# Patient Record
Sex: Female | Born: 1980 | Race: Black or African American | Hispanic: No | Marital: Single | Smoking: Current every day smoker
Health system: Southern US, Community
[De-identification: ages and names within clinical notes are randomized; demographics above are authoritative.]

## PROBLEM LIST (undated history)

## (undated) DIAGNOSIS — L509 Urticaria, unspecified: Secondary | ICD-10-CM

## (undated) DIAGNOSIS — Z973 Presence of spectacles and contact lenses: Secondary | ICD-10-CM

## (undated) DIAGNOSIS — F419 Anxiety disorder, unspecified: Secondary | ICD-10-CM

## (undated) DIAGNOSIS — F319 Bipolar disorder, unspecified: Secondary | ICD-10-CM

## (undated) HISTORY — PX: SALPINGECTOMY: SHX328

## (undated) HISTORY — DX: Urticaria, unspecified: L50.9

## (undated) HISTORY — PX: WISDOM TOOTH EXTRACTION: SHX21

---

## 2019-08-19 ENCOUNTER — Emergency Department (HOSPITAL_COMMUNITY): Payer: Self-pay

## 2019-08-19 ENCOUNTER — Emergency Department (HOSPITAL_COMMUNITY)
Admission: EM | Admit: 2019-08-19 | Discharge: 2019-08-19 | Disposition: A | Discharge status: Home / Self Care | Payer: Self-pay | Attending: Emergency Medicine | Admitting: Emergency Medicine

## 2019-08-19 DIAGNOSIS — X501XXA Overexertion from prolonged static or awkward postures, initial encounter: Secondary | ICD-10-CM | POA: Insufficient documentation

## 2019-08-19 DIAGNOSIS — S93401A Sprain of unspecified ligament of right ankle, initial encounter: Secondary | ICD-10-CM | POA: Insufficient documentation

## 2019-08-19 DIAGNOSIS — Y9301 Activity, walking, marching and hiking: Secondary | ICD-10-CM | POA: Insufficient documentation

## 2019-08-19 DIAGNOSIS — Y929 Unspecified place or not applicable: Secondary | ICD-10-CM | POA: Insufficient documentation

## 2019-08-19 DIAGNOSIS — Y999 Unspecified external cause status: Secondary | ICD-10-CM | POA: Insufficient documentation

## 2019-08-19 MED ORDER — IBUPROFEN 800 MG PO TABS
800.0000 mg | ORAL_TABLET | Freq: Once | ORAL | Status: AC
  Administered 2019-08-19: 800 mg via ORAL
  Filled 2019-08-19: qty 1

## 2019-08-19 MED ORDER — OXYCODONE-ACETAMINOPHEN 5-325 MG PO TABS
1.0000 | ORAL_TABLET | ORAL | Status: DC | PRN
  Filled 2019-08-19: qty 1

## 2019-08-19 NOTE — ED Notes (Signed)
Patient verbalizes understanding of discharge instructions. Opportunity for questioning and answers were provided. Armband removed by staff, pt discharged from ED.  

## 2019-08-19 NOTE — Progress Notes (Signed)
Orthopedic Tech Progress Note Patient Details:  Christina Holden 09-11-1981 [id number]  Ortho Devices Type of Ortho Device: Crutches, CAM walker Ortho Device/Splint Location: right Ortho Device/Splint Interventions: Application   Post Interventions Patient Tolerated: Well Instructions Provided: Care of device   [place] Pink 08/19/2019, 5:01 PM

## 2019-08-19 NOTE — ED Notes (Signed)
Ortho contacted to fit cam walker and crutches.

## 2019-08-19 NOTE — ED Provider Notes (Signed)
[facility] EMERGENCY DEPARTMENT Provider Note   CSN: [id number] Arrival date & time: 08/19/19  1300     History   Chief Complaint Chief Complaint  Patient presents with  . Ankle Pain    HPI Christina Holden is a 38 y.o. female.     38 year old female presents with complaint of right ankle pain.  Patient states that she was walking down a step when she rolled her ankle and felt 3 pops in the outside of her ankle.  Patient has been unable to bear weight since the injury, reports pain to the lateral ankle.  No other injuries, complaints, concerns.  Patient was given Percocet in triage which has not helped with her pain.     No past medical history on file.  There are no active problems to display for this patient.   OB History   No obstetric history on file.      Home Medications    Prior to Admission medications   Not on File    Family History No family history on file.  Social History Social History   Tobacco Use  . Smoking status: Not on file  Substance Use Topics  . Alcohol use: Not on file  . Drug use: Not on file     Allergies   Rocephin [ceftriaxone sodium in dextrose]   Review of Systems Review of Systems  Constitutional: Negative for fever.  Musculoskeletal: Positive for gait problem, joint swelling and myalgias. Negative for arthralgias.  Skin: Negative for rash and wound.  Allergic/Immunologic: Negative for immunocompromised state.  Neurological: Negative for weakness and numbness.  All other systems reviewed and are negative.    Physical Exam Updated Vital Signs BP (!) 172/139 (BP Location: Left Arm)   Pulse (!) 115   Temp 98.9 F (37.2 C) (Oral)   Resp 18   SpO2 98%   Physical Exam Vitals signs and nursing note reviewed.  Constitutional:      General: She is not in acute distress.    Appearance: She is well-developed. She is not diaphoretic.  HENT:     Head: Normocephalic and atraumatic.   Cardiovascular:     Pulses: Normal pulses.  Pulmonary:     Effort: Pulmonary effort is normal.  Musculoskeletal:        General: Swelling and tenderness present. No deformity.     Right ankle: She exhibits decreased range of motion and swelling. She exhibits no deformity, no laceration and normal pulse. Tenderness. Lateral malleolus tenderness found. No head of 5th metatarsal and no proximal fibula tenderness found.  Skin:    General: Skin is warm and dry.     Findings: No erythema or rash.  Neurological:     Mental Status: She is alert and oriented to person, place, and time.     Sensory: No sensory deficit.  Psychiatric:        Behavior: Behavior normal.      ED Treatments / Results  Labs (all labs ordered are listed, but only abnormal results are displayed) Labs Reviewed - No data to display  EKG None  Radiology Dg Ankle Complete Right  Result Date: 08/19/2019 CLINICAL DATA:  Pain and swelling EXAM: RIGHT ANKLE - COMPLETE 3+ VIEW COMPARISON:  None. FINDINGS: No fracture or dislocation of the right ankle. Joint spaces are well preserved. Soft tissue edema over the lateral malleolus. IMPRESSION: No fracture or dislocation of the right ankle. Soft tissue edema over the lateral malleolus. Electronically Signed   By:  Eddie Candle M.D.   On: 08/19/2019 13:48    Procedures Procedures (including critical care time)  Medications Ordered in ED Medications  oxyCODONE-acetaminophen (PERCOCET/ROXICET) 5-325 MG per tablet 1 tablet (has no administration in time range)     Initial Impression / Assessment and Plan / ED Course  I have reviewed the triage vital signs and the nursing notes.  Pertinent labs & imaging results that were available during my care of the patient were reviewed by me and considered in my medical decision making (see chart for details).  Clinical Course as of Aug 18 1620  Fri Aug 18, 2361  8023 38 year old female presents with complaint of right ankle pain.   On exam has swelling with tenderness to lateral malleolus.  X-ray is negative for fracture.  Patient was placed in a cam walker, given crutches to weight-bear as tolerated, follow-up with orthopedics in 1 week if not improving.  Recommend ice and elevate and take Motrin Tylenol for pain.   [LM]    Clinical Course User Index [LM] Tacy Learn, PA-C      Final Clinical Impressions(s) / ED Diagnoses   Final diagnoses:  Sprain of right ankle, unspecified ligament, initial encounter    ED Discharge Orders    None       Tacy Learn, PA-C 08/19/19 1621    Maudie Flakes, MD 08/20/19 [phone]

## 2019-08-19 NOTE — ED Triage Notes (Signed)
Pt reports walking down steps just pta and heard a loud pop. Pt has pain and swelling to right ankle. Pt given ice and propped leg up.

## 2019-08-19 NOTE — Discharge Instructions (Signed)
Wear boot, weight-bear as tolerated. Elevate ankle above the level of your heart, apply ice for 30 minutes at a time.  Take Motrin and Tylenol as needed as directed for pain.   Follow-up with orthopedics in 1 week if not improving.

## 2019-08-19 NOTE — ED Notes (Signed)
Pt given Percocet during triage.

## 2020-01-13 ENCOUNTER — Ambulatory Visit: Payer: Medicaid Other | Attending: Internal Medicine

## 2020-01-13 DIAGNOSIS — Z20822 Contact with and (suspected) exposure to covid-19: Secondary | ICD-10-CM

## 2020-01-14 LAB — NOVEL CORONAVIRUS, NAA: SARS-CoV-2, NAA: NOT DETECTED

## 2020-07-25 ENCOUNTER — Other Ambulatory Visit: Payer: Self-pay

## 2020-07-25 ENCOUNTER — Ambulatory Visit (HOSPITAL_COMMUNITY)
Admission: EM | Admit: 2020-07-25 | Discharge: 2020-07-25 | Disposition: A | Discharge status: Home / Self Care | Payer: Self-pay | Attending: Urgent Care | Admitting: Urgent Care

## 2020-07-25 ENCOUNTER — Encounter (HOSPITAL_COMMUNITY): Payer: Self-pay | Admitting: *Deleted

## 2020-07-25 DIAGNOSIS — K047 Periapical abscess without sinus: Secondary | ICD-10-CM

## 2020-07-25 DIAGNOSIS — K0889 Other specified disorders of teeth and supporting structures: Secondary | ICD-10-CM

## 2020-07-25 MED ORDER — NAPROXEN 500 MG PO TABS: 500.0000 mg | ORAL_TABLET | Freq: Two times a day (BID) | ORAL | 0 refills | Status: DC

## 2020-07-25 MED ORDER — AMOXICILLIN-POT CLAVULANATE 875-125 MG PO TABS: 1.0000 | ORAL_TABLET | Freq: Two times a day (BID) | ORAL | 0 refills | Status: DC

## 2020-07-25 NOTE — ED Triage Notes (Addendum)
Pt reports LT upper tooth broke today and she now has dental pain. Pain 8/10

## 2020-07-25 NOTE — ED Provider Notes (Signed)
°  [facility]   MRN: [id number] DOB: 12-16-1980  Subjective:   Christina Holden is a 39 y.o. female presenting for acute onset of left upper molar pain with facial swelling and tenderness.  Patient states that [place] is [place] from the city, just moved here.  Does not have any dentist.  Has not been able to take any medications for pain.  Has previously taken penicillin and amoxicillin without any issues.  Denies taking chronic medications.  Allergies  Allergen Reactions   Rocephin [Ceftriaxone Sodium In Dextrose]     History reviewed. No pertinent past medical history.   History reviewed. No pertinent surgical history.  History reviewed. No pertinent family history.  Social History   Tobacco Use   Smoking status: Current Every Day Smoker   Smokeless tobacco: Never Used  Vaping Use   Vaping Use: Never used  Substance Use Topics   Alcohol use: Yes    Comment: but not today   Drug use: Yes    Types: Marijuana    Comment: social    ROS   Objective:   Vitals: BP (!) 142/81 (BP Location: Right Arm)    Pulse 92    Temp 98.1 F (36.7 C) (Oral)    Resp 18    Ht 5\' 3"  (1.6 m)    LMP 06/24/2020    SpO2 99%   Physical Exam Constitutional:      General: She is not in acute distress.    Appearance: Normal appearance. She is well-developed. She is not ill-appearing, toxic-appearing or diaphoretic.  HENT:     Head: Normocephalic and atraumatic.     Nose: Nose normal.     Mouth/Throat:     Mouth: Mucous membranes are moist.     Dentition: Abnormal dentition. Gingival swelling and dental abscesses present.     Pharynx: Oropharynx is clear.   Eyes:     General: No scleral icterus.       Right eye: No discharge.        Left eye: No discharge.     Extraocular Movements: Extraocular movements intact.     Conjunctiva/sclera: Conjunctivae normal.     Pupils: Pupils are equal, round, and reactive to light.  Cardiovascular:     Rate and Rhythm: Normal rate.    Pulmonary:     Effort: Pulmonary effort is normal.  Skin:    General: Skin is warm and dry.  Neurological:     General: No focal deficit present.     Mental Status: She is alert and oriented to person, place, and time.  Psychiatric:        Mood and Affect: Mood normal.        Behavior: Behavior normal.        Thought Content: Thought content normal.        Judgment: Judgment normal.      Assessment and Plan :   PDMP not reviewed this encounter.  1. Dental abscess   2. Pain, dental     Start Augmentin for dental infection/abscess, use naproxen for pain and inflammation. Emphasized need for dental surgeon consult. Counseled patient on potential for adverse effects with medications prescribed/recommended today, strict ER and return-to-clinic precautions discussed, patient verbalized understanding.    06/26/2020, Wallis Bamberg 07/25/20 1757

## 2020-07-25 NOTE — Discharge Instructions (Signed)
Make sure you schedule an appointment with a dentist/dental surgeon as soon as possible.  You may try some of the resources below.     [facility] [phone] extension [phone]  Dr. Civils [phone] [address]  [facility] [phone] [address]  [facility] [phone] extension [phone] [place], [place], [address] First come first serve for the first 10 clients.  May do simple extractions only, no wisdom teeth or surgery.  You may try the second for Thursday of the month starting at 6:30 AM.  [employer] You may call the school to see if they are still helping to provide dental care for emergent cases.  

## 2020-08-29 ENCOUNTER — Ambulatory Visit (HOSPITAL_COMMUNITY)
Admission: EM | Admit: 2020-08-29 | Discharge: 2020-08-29 | Disposition: A | Discharge status: Home / Self Care | Payer: Medicaid Other | Attending: Emergency Medicine | Admitting: Emergency Medicine

## 2020-08-29 ENCOUNTER — Other Ambulatory Visit: Payer: Self-pay

## 2020-08-29 ENCOUNTER — Encounter (HOSPITAL_COMMUNITY): Payer: Self-pay | Admitting: Emergency Medicine

## 2020-08-29 DIAGNOSIS — Z20822 Contact with and (suspected) exposure to covid-19: Secondary | ICD-10-CM | POA: Insufficient documentation

## 2020-08-29 DIAGNOSIS — F17200 Nicotine dependence, unspecified, uncomplicated: Secondary | ICD-10-CM | POA: Diagnosis not present

## 2020-08-29 DIAGNOSIS — R112 Nausea with vomiting, unspecified: Secondary | ICD-10-CM | POA: Insufficient documentation

## 2020-08-29 DIAGNOSIS — J069 Acute upper respiratory infection, unspecified: Secondary | ICD-10-CM | POA: Insufficient documentation

## 2020-08-29 LAB — SARS CORONAVIRUS 2 (TAT 6-24 HRS): SARS Coronavirus 2: NEGATIVE

## 2020-08-29 MED ORDER — BENZONATATE 200 MG PO CAPS: 200.0000 mg | ORAL_CAPSULE | Freq: Three times a day (TID) | ORAL | 0 refills | Status: AC | PRN

## 2020-08-29 MED ORDER — ONDANSETRON 4 MG PO TBDP: 4.0000 mg | ORAL_TABLET | Freq: Three times a day (TID) | ORAL | 0 refills | Status: DC | PRN

## 2020-08-29 NOTE — Discharge Instructions (Signed)
Covid test pending, monitor my chart for results Zofran as needed for nausea Rest and drink plenty fluids Tessalon/benzonatate every 8 hours for cough Mucinex DM for further relief of congestion and cough Ibuprofen and tylenol for fever/headache/ aches Return if not improving or worsening

## 2020-08-29 NOTE — ED Provider Notes (Signed)
[facility]    CSN: [id number] Arrival date & time: 08/29/20  0808      History   Chief Complaint Chief Complaint  Patient presents with   Sore Throat   Generalized Body Aches    HPI Christina Holden is a 39 y.o. female presenting today for evaluation of URI symptoms and vomiting.  Patient reports on Friday, approximately 4 days ago began to develop an itchy throat, this resolved and since has developed cough congestion body aches fevers chills.  Has had poor appetite, last night attempted to have Popeyes and developed vomiting.  She has been tolerating liquids and Pedialyte since.  Does not denies any close sick contacts.  HPI  History reviewed. No pertinent past medical history.  There are no problems to display for this patient.   History reviewed. No pertinent surgical history.  OB History   No obstetric history on file.      Home Medications    Prior to Admission medications   Medication Sig Start Date End Date Taking? Authorizing Provider  benzonatate (TESSALON) 200 MG capsule Take 1 capsule (200 mg total) by mouth 3 (three) times daily as needed for up to 7 days for cough. 08/29/20 09/05/20  Torris House C, PA-C  ondansetron (ZOFRAN ODT) 4 MG disintegrating tablet Take 1 tablet (4 mg total) by mouth every 8 (eight) hours as needed for nausea or vomiting. 08/29/20   Ervan Heber, Junius Creamer, PA-C    Family History Family History  Problem Relation Age of Onset   Sarcoidosis Mother    Seizures Father     Social History Social History   Tobacco Use   Smoking status: Current Every Day Smoker   Smokeless tobacco: Never Used  Building services engineer Use: Never used  Substance Use Topics   Alcohol use: Yes    Comment: but not today   Drug use: Yes    Types: Marijuana    Comment: social     Allergies   Rocephin [ceftriaxone sodium in dextrose]   Review of Systems Review of Systems  Constitutional: Positive for appetite change, chills,  fatigue and fever. Negative for activity change.  HENT: Positive for congestion, rhinorrhea and sore throat. Negative for ear pain, sinus pressure and trouble swallowing.   Eyes: Negative for discharge and redness.  Respiratory: Positive for cough. Negative for chest tightness and shortness of breath.   Cardiovascular: Negative for chest pain.  Gastrointestinal: Positive for nausea and vomiting. Negative for abdominal pain and diarrhea.  Musculoskeletal: Negative for myalgias.  Skin: Negative for rash.  Neurological: Negative for dizziness, light-headedness and headaches.     Physical Exam Triage Vital Signs ED Triage Vitals  Enc Vitals Group     BP 08/29/20 0848 (!) 128/98     Pulse Rate 08/29/20 0848 (!) 103     Resp 08/29/20 0848 20     Temp 08/29/20 0848 98.8 F (37.1 C)     Temp Source 08/29/20 0848 Oral     SpO2 08/29/20 0848 99 %     Weight --      Height --      Head Circumference --      Peak Flow --      Pain Score 08/29/20 0844 7     Pain Loc --      Pain Edu? --      Excl. in GC? --    No data found.  Updated Vital Signs BP (!) 128/98 (BP Location: Left Arm)  Comment: large cuff   Pulse (!) 103    Temp 98.8 F (37.1 C) (Oral)    Resp 20    SpO2 99%   Visual Acuity Right Eye Distance:   Left Eye Distance:   Bilateral Distance:    Right Eye Near:   Left Eye Near:    Bilateral Near:     Physical Exam Vitals and nursing note reviewed.  Constitutional:      Appearance: She is well-developed.     Comments: No acute distress  HENT:     Head: Normocephalic and atraumatic.     Ears:     Comments: Bilateral ears without tenderness to palpation of external auricle, tragus and mastoid, EAC's without erythema or swelling, TM's with good bony landmarks and cone of light. Non erythematous.     Nose: Nose normal.     Mouth/Throat:     Comments: Oral mucosa pink and moist, no tonsillar enlargement or exudate. Posterior pharynx patent and nonerythematous, no uvula  deviation or swelling. Normal phonation. Eyes:     Conjunctiva/sclera: Conjunctivae normal.  Cardiovascular:     Rate and Rhythm: Normal rate.  Pulmonary:     Effort: Pulmonary effort is normal. No respiratory distress.     Comments: Breathing comfortably at rest, CTABL, no wheezing, rales or other adventitious sounds auscultated Abdominal:     General: There is no distension.     Comments: Soft, nondistended, nontender light deep palpation.abdomen  Musculoskeletal:        General: Normal range of motion.     Cervical back: Neck supple.  Skin:    General: Skin is warm and dry.  Neurological:     Mental Status: She is alert and oriented to person, place, and time.      UC Treatments / Results  Labs (all labs ordered are listed, but only abnormal results are displayed) Labs Reviewed  SARS CORONAVIRUS 2 (TAT 6-24 HRS)    EKG   Radiology No results found.  Procedures Procedures (including critical care time)  Medications Ordered in UC Medications - No data to display  Initial Impression / Assessment and Plan / UC Course  I have reviewed the triage vital signs and the nursing notes.  Pertinent labs & imaging results that were available during my care of the patient were reviewed by me and considered in my medical decision making (see chart for details).     Covid test pending, suspect viral URI recommending symptomatic and supportive care of URI symptoms and GI symptoms.  No abdominal tenderness.  Discussed strict return precautions. Patient verbalized understanding and is agreeable with plan.  Final Clinical Impressions(s) / UC Diagnoses   Final diagnoses:  Viral URI with cough  Non-intractable vomiting with nausea, unspecified vomiting type     Discharge Instructions     Covid test pending, monitor my chart for results Zofran as needed for nausea Rest and drink plenty fluids Tessalon/benzonatate every 8 hours for cough Mucinex DM for further relief of  congestion and cough Ibuprofen and tylenol for fever/headache/ aches Return if not improving or worsening     ED Prescriptions    Medication Sig Dispense Auth. Provider   benzonatate (TESSALON) 200 MG capsule Take 1 capsule (200 mg total) by mouth 3 (three) times daily as needed for up to 7 days for cough. 28 capsule Keneth Borg C, PA-C   ondansetron (ZOFRAN ODT) 4 MG disintegrating tablet Take 1 tablet (4 mg total) by mouth every 8 (eight) hours as needed  for nausea or vomiting. 20 tablet Rebbecca Osuna, [place] C, PA-C     PDMP not reviewed this encounter.   Lew Dawes, [place] 08/29/20 [phone]

## 2020-08-29 NOTE — ED Triage Notes (Signed)
Patient started feeling bad on Friday.  Initially had a scratchy throat.  Now has runny nose, chills, fever, general body aches.    Reports vomiting yesterday evening.  No vomiting today.   Patient has taken theraflu for symptoms.

## 2020-10-23 ENCOUNTER — Encounter (HOSPITAL_COMMUNITY): Payer: Self-pay | Admitting: Emergency Medicine

## 2020-10-23 ENCOUNTER — Other Ambulatory Visit: Payer: Self-pay

## 2020-10-23 ENCOUNTER — Ambulatory Visit (HOSPITAL_COMMUNITY)
Admission: EM | Admit: 2020-10-23 | Discharge: 2020-10-23 | Disposition: A | Discharge status: Home / Self Care | Payer: Medicaid Other | Attending: Internal Medicine | Admitting: Internal Medicine

## 2020-10-23 DIAGNOSIS — Z20822 Contact with and (suspected) exposure to covid-19: Secondary | ICD-10-CM | POA: Diagnosis present

## 2020-10-23 DIAGNOSIS — J111 Influenza due to unidentified influenza virus with other respiratory manifestations: Secondary | ICD-10-CM

## 2020-10-23 MED ORDER — ACETAMINOPHEN 325 MG PO TABS
975.0000 mg | ORAL_TABLET | Freq: Once | ORAL | Status: AC
  Administered 2020-10-23: 975 mg via ORAL

## 2020-10-23 MED ORDER — ONDANSETRON 4 MG PO TBDP
4.0000 mg | ORAL_TABLET | Freq: Once | ORAL | Status: AC
  Administered 2020-10-23: 4 mg via ORAL

## 2020-10-23 MED ORDER — ONDANSETRON HCL 4 MG PO TABS: 4.0000 mg | ORAL_TABLET | Freq: Four times a day (QID) | ORAL | 0 refills | Status: DC

## 2020-10-23 MED ORDER — ACETAMINOPHEN 500 MG PO TABS: 1000.0000 mg | ORAL_TABLET | Freq: Four times a day (QID) | ORAL | 0 refills | Status: DC | PRN

## 2020-10-23 MED ORDER — OSELTAMIVIR PHOSPHATE 75 MG PO CAPS: 75.0000 mg | ORAL_CAPSULE | Freq: Two times a day (BID) | ORAL | 0 refills | Status: DC

## 2020-10-23 MED ORDER — ACETAMINOPHEN 325 MG PO TABS
ORAL_TABLET | ORAL | Status: AC
  Filled 2020-10-23: qty 3

## 2020-10-23 MED ORDER — ONDANSETRON 4 MG PO TBDP
ORAL_TABLET | ORAL | Status: AC
  Filled 2020-10-23: qty 1

## 2020-10-23 NOTE — ED Triage Notes (Signed)
Patient c/o headache, generalized body aches, and fever x 1 day.   Patient endorses dizziness and fever of 101 F at home.   Patient endorses a recent COVID-19 exposure.   Patient has taken Aleve w/ no relief of symptoms.

## 2020-10-23 NOTE — Discharge Instructions (Addendum)
Take Tylenol up to 1000 mg every 6 hours and you may take Ibuprofen 800 mg in between Tylenol doses.   If your Covid test ends up positive you may take the following supplements to help your immune system be stronger to fight this viral infection Take Quarcetin 500 mg three times a day x 7 days with Zinc 50 mg ones a day x 7 days. The quarcetin is an antiviral and anti-inflammatory supplement which helps open the zinc channels in the cell to absorb Zinc. Zinc helps decrease the virus load in your body. Take Melatonin 6-10 mg at bed time which also helps support your immune system.  Also make sure to take Vit D 5,000 IU per day with a fatty meal and Vit C 1000 mg a day until you are completely better. Stay on Vitamin D 2,000  and C the rest of the season.  Don't lay around, keep active and walk as much as you are able to to prevent worsening of your symptoms.  Follow up with your family Dr next week.  If you get short of breath and you are able to check  your oxygen with a pulse oxygen meter, if it gets to 92% or less, you need to go to the hospital to be admitted. If you dont have one, come back here and we will assess you.

## 2020-10-23 NOTE — ED Provider Notes (Signed)
[facility]    CSN: [id number] Arrival date & time: 10/23/20  1554      History   Chief Complaint Chief Complaint  Patient presents with  . Generalized Body Aches    HPI Christina Holden is a 39 y.o. female who presents with onset of HA, body aches and fever for 1 day. Has had a fever up to 101.  Has been around a positive covid relative whom she spent xmas with. Has been taking Aleeve but is not helping. Has felt nauseous today. Denies UTI symptoms, cough, or abnormal vaginal discharge.    History reviewed. No pertinent past medical history.  There are no problems to display for this patient.   History reviewed. No pertinent surgical history.  OB History   No obstetric history on file.      Home Medications    Prior to Admission medications   Medication Sig Start Date End Date Taking? Authorizing Provider  ondansetron (ZOFRAN ODT) 4 MG disintegrating tablet Take 1 tablet (4 mg total) by mouth every 8 (eight) hours as needed for nausea or vomiting. 08/29/20   Wieters, Junius Creamer, PA-C    Family History Family History  Problem Relation Age of Onset  . Sarcoidosis Mother   . Seizures Father     Social History Social History   Tobacco Use  . Smoking status: Current Every Day Smoker  . Smokeless tobacco: Never Used  Vaping Use  . Vaping Use: Never used  Substance Use Topics  . Alcohol use: Yes    Comment: but not today  . Drug use: Yes    Types: Marijuana    Comment: social     Allergies   Rocephin [ceftriaxone sodium in dextrose]   Review of Systems Review of Systems  Constitutional: Positive for activity change, appetite change, chills, diaphoresis, fatigue and fever.  HENT: Negative for congestion, dental problem, ear discharge, ear pain, postnasal drip, rhinorrhea, sinus pressure, sore throat and trouble swallowing.   Eyes: Negative for discharge.  Respiratory: Negative for cough, chest tightness and shortness of breath.    Cardiovascular: Negative for chest pain.  Gastrointestinal: Positive for nausea. Negative for abdominal pain, diarrhea and vomiting.  Genitourinary: Negative for difficulty urinating, dysuria and vaginal discharge.  Musculoskeletal: Positive for myalgias. Negative for gait problem.  Skin: Negative for rash.  Neurological: Positive for headaches. Negative for dizziness and weakness.     Physical Exam Triage Vital Signs ED Triage Vitals  Enc Vitals Group     BP 10/23/20 1826 128/73     Pulse Rate 10/23/20 1826 100     Resp 10/23/20 1826 19     Temp 10/23/20 1826 100.1 F (37.8 C)     Temp Source 10/23/20 1826 Oral     SpO2 10/23/20 1826 100 %     Weight 10/23/20 1824 205 lb (93 kg)     Height 10/23/20 1824 5' 4.5" (1.638 m)     Head Circumference --      Peak Flow --      Pain Score 10/23/20 1824 10     Pain Loc --      Pain Edu? --      Excl. in GC? --    No data found.  Updated Vital Signs BP 128/73 (BP Location: Left Arm)   Pulse 100   Temp 100.1 F (37.8 C) (Oral)   Resp 19   Ht 5' 4.5" (1.638 m)   Wt 205 lb (93 kg)   LMP 09/24/2020  SpO2 100%   BMI 34.64 kg/m   Visual Acuity Right Eye Distance:   Left Eye Distance:   Bilateral Distance:    Right Eye Near:   Left Eye Near:    Bilateral Near:     Physical Exam   UC Treatments / Results  Labs (all labs ordered are listed, but only abnormal results are displayed) Labs Reviewed  RESP PANEL BY RT-PCR (FLU A&B, COVID) [id number]    EKG   Radiology No results found.  Procedures Procedures (including critical care time)  Medications Ordered in UC Medications - No data to display  Initial Impression / Assessment and Plan / UC Course  I have reviewed the triage vital signs and the nursing notes. Covid and flu test are pending.  She will check her mychart for results. We will call her if positive. I have her Rx for Tamiflu to fill if her flu test is positive. See instructions  Final Clinical  Impressions(s) / UC Diagnoses   Final diagnoses:  None   Discharge Instructions   None    ED Prescriptions    None     PDMP not reviewed this encounter.   Garey Ham, Cordelia Poche 10/23/20 1914

## 2020-10-24 LAB — RESP PANEL BY RT-PCR (FLU A&B, COVID) ARPGX2
Influenza A by PCR: NEGATIVE
Influenza B by PCR: NEGATIVE
SARS Coronavirus 2 by RT PCR: POSITIVE — AB

## 2021-05-01 ENCOUNTER — Ambulatory Visit (HOSPITAL_COMMUNITY)
Admission: EM | Admit: 2021-05-01 | Discharge: 2021-05-01 | Disposition: A | Discharge status: Home / Self Care | Payer: Commercial Managed Care - PPO | Attending: Medical Oncology | Admitting: Medical Oncology

## 2021-05-01 ENCOUNTER — Encounter (HOSPITAL_COMMUNITY): Payer: Self-pay | Admitting: *Deleted

## 2021-05-01 ENCOUNTER — Other Ambulatory Visit: Payer: Self-pay

## 2021-05-01 DIAGNOSIS — T7840XA Allergy, unspecified, initial encounter: Secondary | ICD-10-CM

## 2021-05-01 DIAGNOSIS — L509 Urticaria, unspecified: Secondary | ICD-10-CM

## 2021-05-01 MED ORDER — PREDNISONE 10 MG (21) PO TBPK: ORAL_TABLET | Freq: Every day | ORAL | 0 refills | Status: DC

## 2021-05-01 MED ORDER — DOXYCYCLINE HYCLATE 100 MG PO CAPS: 100.0000 mg | ORAL_CAPSULE | Freq: Two times a day (BID) | ORAL | 0 refills | Status: DC

## 2021-05-01 MED ORDER — DIPHENHYDRAMINE HCL 25 MG PO CAPS
ORAL_CAPSULE | ORAL | Status: AC
  Filled 2021-05-01: qty 2

## 2021-05-01 MED ORDER — DEXAMETHASONE SODIUM PHOSPHATE 10 MG/ML IJ SOLN
INTRAMUSCULAR | Status: AC
  Filled 2021-05-01: qty 1

## 2021-05-01 MED ORDER — FAMOTIDINE 20 MG PO TABS
ORAL_TABLET | ORAL | Status: AC
  Filled 2021-05-01: qty 2

## 2021-05-01 MED ORDER — DEXAMETHASONE SODIUM PHOSPHATE 10 MG/ML IJ SOLN
10.0000 mg | Freq: Once | INTRAMUSCULAR | Status: AC
  Administered 2021-05-01: 10 mg via INTRAMUSCULAR

## 2021-05-01 MED ORDER — DIPHENHYDRAMINE HCL 25 MG PO CAPS
50.0000 mg | ORAL_CAPSULE | Freq: Once | ORAL | Status: AC
  Administered 2021-05-01: 50 mg via ORAL

## 2021-05-01 MED ORDER — FAMOTIDINE 20 MG PO TABS
40.0000 mg | ORAL_TABLET | Freq: Every day | ORAL | Status: DC
  Administered 2021-05-01: 40 mg via ORAL

## 2021-05-01 NOTE — ED Provider Notes (Addendum)
[facility]    CSN: [id number] Arrival date & time: 05/01/21  1411      History   Chief Complaint Chief Complaint  Patient presents with   Allergic Reaction   swelling generlized    HPI Christina Holden is a 40 y.o. female.   HPI  Allergic reaction: I was alerted to patient immediately when she arrived to the office and she was evaluated in triage.  Patient was in acute distress and reported that she felt that she was having an allergic reaction.  She states that she had a bath with a new bath supplement last night.  Since then she has developed itching of her skin along with a red rash.  Last night she felt as if her tongue may have been slightly swollen however this has resolved.  She does have a bit of a headache today but is concerned that she is having worsening symptoms as the rash seems to be spreading.  No wheezing.  No chest pain.  No GI distress.  She does have some shortness of breath but she does admit that she believes she is having a panic attack I do fear that she is having an allergic reaction.  She denies any trouble swallowing.  Patient is speaking in full sentences.  She has no history of anaphylactic reaction.  She reports that she did take a COVID-19 test this morning which was negative due to her symptoms.  She has not taken anything else for symptoms. [facility]: UTD.   History reviewed. No pertinent past medical history.  There are no problems to display for this patient.   History reviewed. No pertinent surgical history.  OB History   No obstetric history on file.      Home Medications    Prior to Admission medications   Medication Sig Start Date End Date Taking? Authorizing Provider  acetaminophen (TYLENOL) 500 MG tablet Take 2 tablets (1,000 mg total) by mouth every 6 (six) hours as needed. Body aches and fever 10/23/20   Rodriguez-Southworth, Nettie Elm, PA-C  ondansetron (ZOFRAN ODT) 4 MG disintegrating tablet Take 1 tablet (4 mg total) by mouth  every 8 (eight) hours as needed for nausea or vomiting. 08/29/20   Wieters, Hallie C, PA-C  ondansetron (ZOFRAN) 4 MG tablet Take 1 tablet (4 mg total) by mouth every 6 (six) hours. 10/23/20   Rodriguez-Southworth, Nettie Elm, PA-C  oseltamivir (TAMIFLU) 75 MG capsule Take 1 capsule (75 mg total) by mouth every 12 (twelve) hours. 10/23/20   Rodriguez-Southworth, Nettie Elm, PA-C    Family History Family History  Problem Relation Age of Onset   Sarcoidosis Mother    Seizures Father     Social History Social History   Tobacco Use   Smoking status: Every Day    Pack years: 0.00   Smokeless tobacco: Never  Vaping Use   Vaping Use: Never used  Substance Use Topics   Alcohol use: Yes    Comment: but not today   Drug use: Yes    Types: Marijuana    Comment: social     Allergies   Rocephin [ceftriaxone sodium in dextrose]   Review of Systems Review of Systems  As stated above in HPI Physical Exam Triage Vital Signs ED Triage Vitals  Enc Vitals Group     BP 05/01/21 1413 (!) 113/101     Pulse Rate 05/01/21 1413 (!) 122     Resp 05/01/21 1413 (!) 25     Temp 05/01/21 1413 100 F (37.8  C)     Temp src --      SpO2 05/01/21 1413 100 %     Weight --      Height --      Head Circumference --      Peak Flow --      Pain Score 05/01/21 1414 10     Pain Loc --      Pain Edu? --      Excl. in [place]? --    No data found.  Updated Vital Signs BP 127/78 (BP Location: Left Arm)   Pulse (!) 102   Temp 100 F (37.8 C)   Resp 18   LMP 04/08/2021   SpO2 98%    Physical Exam Vitals and nursing note reviewed.  Constitutional:      General: She is in acute distress.     Appearance: Normal appearance. She is diaphoretic. She is not ill-appearing or toxic-appearing.  HENT:     Head: Normocephalic and atraumatic.     Nose: Nose normal.     Mouth/Throat:     Mouth: Mucous membranes are moist.     Pharynx: Oropharynx is clear.     Comments: Very clear airway without any swelling of  the mouth or tongue.  No facial swelling Eyes:     Extraocular Movements: Extraocular movements intact.     Pupils: Pupils are equal, round, and reactive to light.  Cardiovascular:     Rate and Rhythm: Regular rhythm. Tachycardia present.     Pulses: Normal pulses.     Heart sounds: Normal heart sounds.  Pulmonary:     Effort: Pulmonary effort is normal. No respiratory distress.     Breath sounds: Normal breath sounds. No stridor. No wheezing.  Musculoskeletal:     Cervical back: Neck supple.  Lymphadenopathy:     Cervical: No cervical adenopathy.  Skin:    Comments: Urticaria of legs, trunk and arms  Neurological:     Mental Status: She is alert and oriented to person, place, and time.  Psychiatric:     Comments: Patient appears extremely anxious and is very tearful     UC Treatments / Results  Labs (all labs ordered are listed, but only abnormal results are displayed) Labs Reviewed - No data to display  EKG   Radiology No results found.  Procedures Procedures (including critical care time)  Medications Ordered in UC Medications  famotidine (PEPCID) tablet 40 mg (40 mg Oral Given 05/01/21 1426)  dexamethasone (DECADRON) injection 10 mg (10 mg Intramuscular Given 05/01/21 1426)  diphenhydrAMINE (BENADRYL) capsule 50 mg (50 mg Oral Given 05/01/21 1421)    Initial Impression / Assessment and Plan / UC Course  I have reviewed the triage vital signs and the nursing notes.  Pertinent labs & imaging results that were available during my care of the patient were reviewed by me and considered in my medical decision making (see chart for details).     New.  I reviewed her vital signs with her which showed a oxygen of 100% along with a very good airway.  Immediately after having this discussion her tachycardia greatly improved and she felt much better.  We elected to go ahead and treat her for her urticaria and a suspected allergic reaction to the bath supplement but without any  evidence or risk of anaphylaxis so we are holding off on epinephrine at this time.  She was given Pepcid, Decadron and Benadryl.  She was monitored and vitals repeated.  Patient  continued to improve greatly.  After about 35 minutes she reportedly felt much better and her urticaria resolved.  Vitals improved.  She was sent home with prednisone for her to start tomorrow given her decadron rx given today to help prevent further systemic complications. She will not use the bath supplement again as she is likely allergic.    Final Clinical Impressions(s) / UC Diagnoses   Final diagnoses:  None   Discharge Instructions   None    ED Prescriptions   None    PDMP not reviewed this encounter.   Rushie Chestnut, PA-C 05/01/21 1538    [address] PA-C 05/01/21 1539

## 2021-11-26 ENCOUNTER — Ambulatory Visit (INDEPENDENT_AMBULATORY_CARE_PROVIDER_SITE_OTHER): Payer: Self-pay

## 2021-11-26 ENCOUNTER — Encounter (HOSPITAL_COMMUNITY): Payer: Self-pay

## 2021-11-26 ENCOUNTER — Other Ambulatory Visit: Payer: Self-pay

## 2021-11-26 ENCOUNTER — Ambulatory Visit (HOSPITAL_COMMUNITY)
Admission: EM | Admit: 2021-11-26 | Discharge: 2021-11-26 | Disposition: A | Discharge status: Home / Self Care | Payer: Self-pay | Attending: Family Medicine | Admitting: Family Medicine

## 2021-11-26 DIAGNOSIS — M79645 Pain in left finger(s): Secondary | ICD-10-CM

## 2021-11-26 MED ORDER — IBUPROFEN 800 MG PO TABS: 800.0000 mg | ORAL_TABLET | Freq: Three times a day (TID) | ORAL | 0 refills | Status: DC | PRN

## 2021-11-26 NOTE — ED Provider Notes (Signed)
[place]    CSN: [id number] Arrival date & time: 11/26/21  [id number]      History   Chief Complaint Chief Complaint  Patient presents with   Finger Injury    HPI Christina Holden is a 41 y.o. female.   HPI Here for left middle finger pain.  It began yesterday after she shut it in a car door.  It is swollen some and red.  No fever at home.  She has not gotten to try any over-the-counter medicine so far  History reviewed. No pertinent past medical history.  There are no problems to display for this patient.   History reviewed. No pertinent surgical history.  OB History   No obstetric history on file.      Home Medications    Prior to Admission medications   Medication Sig Start Date End Date Taking? Authorizing Provider  ibuprofen (ADVIL) 800 MG tablet Take 1 tablet (800 mg total) by mouth every 8 (eight) hours as needed (pain). 11/26/21  Yes Barrett Henle, MD  acetaminophen (TYLENOL) 500 MG tablet Take 2 tablets (1,000 mg total) by mouth every 6 (six) hours as needed. Body aches and fever 10/23/20   Rodriguez-Southworth, Sunday Spillers, PA-C    Family History Family History  Problem Relation Age of Onset   Sarcoidosis Mother    Seizures Father     Social History Social History   Tobacco Use   Smoking status: Every Day   Smokeless tobacco: Never  Vaping Use   Vaping Use: Never used  Substance Use Topics   Alcohol use: Yes    Comment: but not today   Drug use: Yes    Types: Marijuana    Comment: social     Allergies   Rocephin [ceftriaxone sodium in dextrose]   Review of Systems Review of Systems   Physical Exam Triage Vital Signs ED Triage Vitals  Enc Vitals Group     BP 11/26/21 0845 113/78     Pulse Rate 11/26/21 0845 93     Resp 11/26/21 0845 14     Temp 11/26/21 0845 97.8 F (36.6 C)     Temp Source 11/26/21 0845 Oral     SpO2 11/26/21 0845 95 %     Weight --      Height --      Head Circumference --      Peak Flow --       Pain Score 11/26/21 0847 8     Pain Loc --      Pain Edu? --      Excl. in [place]? --    No data found.  Updated Vital Signs BP 113/78 (BP Location: Right Arm)    Pulse 93    Temp 97.8 F (36.6 C) (Oral)    Resp 14    LMP 11/11/2021 (Approximate)    SpO2 95%   Visual Acuity Right Eye Distance:   Left Eye Distance:   Bilateral Distance:    Right Eye Near:   Left Eye Near:    Bilateral Near:     Physical Exam Vitals reviewed.  Constitutional:      Comments: In some obvious pain  Musculoskeletal:     Comments: The left middle finger is tender, swollen some over the distal and middle phalanx  Skin:    Coloration: Skin is not pale.  Neurological:     General: No focal deficit present.     Mental Status: She is alert and oriented to person,  place, and time.  Psychiatric:        Behavior: Behavior normal.     UC Treatments / Results  Labs (all labs ordered are listed, but only abnormal results are displayed) Labs Reviewed - No data to display  EKG   Radiology DG Finger Middle Left  Result Date: 11/26/2021 CLINICAL DATA:  Left middle finger injury.  Finger closed in a door. EXAM: LEFT MIDDLE FINGER 2+V COMPARISON:  None. FINDINGS: The mineralization and alignment are normal. There is no evidence of acute fracture or dislocation. The joint spaces are preserved. Possible mild soft tissue swelling, but no evidence of foreign body or soft tissue emphysema. IMPRESSION: No evidence of acute fracture, dislocation or foreign body. Electronically Signed   By: Richardean Sale M.D.   On: 11/26/2021 09:00    Procedures Procedures (including critical care time)  Medications Ordered in UC Medications - No data to display  Initial Impression / Assessment and Plan / UC Course  I have reviewed the triage vital signs and the nursing notes.  Pertinent labs & imaging results that were available during my care of the patient were reviewed by me and considered in my medical decision making  (see chart for details).     X-rays negative for fracture.  Discussed that the pain is coming from the hematoma and bruising in her finger. Final Clinical Impressions(s) / UC Diagnoses   Final diagnoses:  Pain of finger of left hand     Discharge Instructions      Elevate your hand and ice that finger today and tomorrow.  Take ibuprofen 800 mg 1 tab 3 times daily as needed for pain.     ED Prescriptions     Medication Sig Dispense Auth. Provider   ibuprofen (ADVIL) 800 MG tablet Take 1 tablet (800 mg total) by mouth every 8 (eight) hours as needed (pain). 21 tablet Miley Lindon, Gwenlyn Perking, MD      I have reviewed the PDMP during this encounter.   Barrett Henle, MD 11/26/21 1001

## 2021-11-26 NOTE — Discharge Instructions (Signed)
Elevate your hand and ice that finger today and tomorrow.  Take ibuprofen 800 mg 1 tab 3 times daily as needed for pain.

## 2021-11-26 NOTE — ED Triage Notes (Signed)
Pt presents with left middle finger injury after closing it in a car door

## 2021-11-30 ENCOUNTER — Other Ambulatory Visit: Payer: Self-pay

## 2021-11-30 ENCOUNTER — Encounter (HOSPITAL_COMMUNITY): Payer: Self-pay | Admitting: *Deleted

## 2021-11-30 ENCOUNTER — Ambulatory Visit (HOSPITAL_COMMUNITY)
Admission: EM | Admit: 2021-11-30 | Discharge: 2021-11-30 | Disposition: A | Discharge status: Home / Self Care | Payer: Self-pay | Attending: Urgent Care | Admitting: Urgent Care

## 2021-11-30 DIAGNOSIS — R102 Pelvic and perineal pain: Secondary | ICD-10-CM | POA: Insufficient documentation

## 2021-11-30 LAB — POCT URINALYSIS DIPSTICK, ED / UC
Bilirubin Urine: NEGATIVE
Glucose, UA: NEGATIVE mg/dL
Hgb urine dipstick: NEGATIVE
Ketones, ur: NEGATIVE mg/dL
Leukocytes,Ua: NEGATIVE
Nitrite: NEGATIVE
Protein, ur: NEGATIVE mg/dL
Specific Gravity, Urine: 1.025 (ref 1.005–1.030)
Urobilinogen, UA: 0.2 mg/dL (ref 0.0–1.0)
pH: 5.5 (ref 5.0–8.0)

## 2021-11-30 LAB — POC URINE PREG, ED: Preg Test, Ur: NEGATIVE

## 2021-11-30 MED ORDER — MEFENAMIC ACID 250 MG PO CAPS: ORAL_CAPSULE | ORAL | 0 refills | Status: DC

## 2021-11-30 NOTE — ED Provider Notes (Signed)
[facility]    CSN: [id number] Arrival date & time: 11/30/21  1008      History   Chief Complaint Chief Complaint  Patient presents with   Abdominal Pain    HPI Christina Holden is a 41 y.o. female.   Pleasant 41 year old female presents today with an acute onset of left sided lower abdominal discomfort for the past 3 days.  She states it started on Wednesday and is crampy in nature.  She states that her last normal menstrual period was on January 12 to 14.  She states it feels similar to her menstrual periods, but would be early.  She normally is scheduled.  She states she is newly sexually active again.  She states it feels like her "insides are swollen".  She states she takes a tea to help with her menstrual cramps, but it did not help with this current discomfort.  She states there is slight pink blood on the paper when wiping, she denies any urinary symptoms, she denies any vaginal discharge.  She denies any change to her bowel movements.  She states she is eating normally without any change to her appetite.  She has not had a fever.  She takes no daily medications and otherwise has no chronic medical issues.   Abdominal Pain Associated symptoms: no constipation, no diarrhea, no nausea and no vomiting    History reviewed. No pertinent past medical history.  There are no problems to display for this patient.   History reviewed. No pertinent surgical history.  OB History   No obstetric history on file.      Home Medications    Prior to Admission medications   Medication Sig Start Date End Date Taking? Authorizing Provider  Mefenamic Acid 250 MG CAPS Take two caps PO x 1 dose, followed by one cap PO every 6 hours PRN pain x no more than 5 days consecutively 11/30/21  Yes Zakayla Martinec L, PA  acetaminophen (TYLENOL) 500 MG tablet Take 2 tablets (1,000 mg total) by mouth every 6 (six) hours as needed. Body aches and fever 10/23/20   Rodriguez-Southworth, Nettie Elm, PA-C     Family History Family History  Problem Relation Age of Onset   Sarcoidosis Mother    Seizures Father     Social History Social History   Tobacco Use   Smoking status: Every Day   Smokeless tobacco: Never  Vaping Use   Vaping Use: Never used  Substance Use Topics   Alcohol use: Yes    Comment: but not today   Drug use: Yes    Types: Marijuana    Comment: social     Allergies   Rocephin [ceftriaxone sodium in dextrose]   Review of Systems Review of Systems  Gastrointestinal:  Positive for abdominal pain (LLQ). Negative for abdominal distention, anal bleeding, blood in stool, constipation, diarrhea, nausea, rectal pain and vomiting.  All other systems reviewed and are negative.   Physical Exam Triage Vital Signs ED Triage Vitals  Enc Vitals Group     BP 11/30/21 1029 113/84     Pulse Rate 11/30/21 1029 85     Resp 11/30/21 1029 18     Temp 11/30/21 1029 98.4 F (36.9 C)     Temp src --      SpO2 11/30/21 1029 99 %     Weight --      Height --      Head Circumference --      Peak Flow --  Pain Score 11/30/21 1026 8     Pain Loc --      Pain Edu? --      Excl. in [place]? --    No data found.  Updated Vital Signs BP 113/84    Pulse 85    Temp 98.4 F (36.9 C)    Resp 18    LMP 11/11/2021 (Approximate)    SpO2 99%   Visual Acuity Right Eye Distance:   Left Eye Distance:   Bilateral Distance:    Right Eye Near:   Left Eye Near:    Bilateral Near:     Physical Exam Vitals and nursing note reviewed. Exam conducted with a chaperone present.  Constitutional:      General: She is not in acute distress.    Appearance: She is well-developed. She is obese. She is not ill-appearing, toxic-appearing or diaphoretic.  HENT:     Head: Normocephalic and atraumatic.     Mouth/Throat:     Mouth: Mucous membranes are moist.  Eyes:     Extraocular Movements: Extraocular movements intact.  Cardiovascular:     Rate and Rhythm: Normal rate.     Heart sounds:  Normal heart sounds. No murmur heard.   No friction rub. No gallop.  Pulmonary:     Effort: Pulmonary effort is normal. No respiratory distress.     Breath sounds: Normal breath sounds. No stridor. No wheezing or rhonchi.  Abdominal:     General: Abdomen is flat. Bowel sounds are normal. There is no distension. There are no signs of injury.     Palpations: Abdomen is soft. There is no shifting dullness, fluid wave, hepatomegaly, splenomegaly, mass or pulsatile mass.     Tenderness: There is abdominal tenderness (MINIMAL tenderness, NOT reproducible. NO rebound guarding or rigidity. NO signs of acute abdomen) in the left lower quadrant. There is no right CVA tenderness, left CVA tenderness, guarding or rebound. Negative signs include Murphy's sign, Rovsing's sign, McBurney's sign, psoas sign and obturator sign.     Hernia: No hernia is present.  Genitourinary:    General: Normal vulva.     Pubic Area: No rash or pubic lice.      Labia:        Right: No rash, tenderness, lesion or injury.        Left: No rash, tenderness, lesion or injury.      Urethra: No prolapse, urethral pain, urethral swelling or urethral lesion.     Vagina: Normal. No signs of injury and foreign body. No vaginal discharge, erythema, tenderness, bleeding, lesions or prolapsed vaginal walls.     Cervix: No cervical motion tenderness, discharge (scant blood without obvious discharge from cervical os), friability or eversion.     Uterus: Normal. Not deviated, not enlarged, not fixed, not tender and no uterine prolapse.      Adnexa: Right adnexa normal and left adnexa normal.       Right: No mass, tenderness or fullness.         Left: No mass, tenderness or fullness.    Skin:    General: Skin is warm.     Capillary Refill: Capillary refill takes less than 2 seconds.     Coloration: Skin is not jaundiced or pale.     Findings: No erythema or rash.  Neurological:     General: No focal deficit present.     Mental Status:  She is alert and oriented to person, place, and time.  Psychiatric:  Mood and Affect: Mood normal.        Behavior: Behavior normal.     UC Treatments / Results  Labs (all labs ordered are listed, but only abnormal results are displayed) Labs Reviewed  POCT URINALYSIS DIPSTICK, ED / UC  POC URINE PREG, ED  CERVICOVAGINAL ANCILLARY ONLY    EKG   Radiology No results found.  Procedures Procedures (including critical care time)  Medications Ordered in UC Medications - No data to display  Initial Impression / Assessment and Plan / UC Course  I have reviewed the triage vital signs and the nursing notes.  Pertinent labs & imaging results that were available during my care of the patient were reviewed by me and considered in my medical decision making (see chart for details).     Pelvic pain - UA and pregnancy negative. No clinical findings of PID. Pt believes her pain may be related to intercourse with a new partner. Aptima swab obtained, no physical exam findings that warrant abx treatment today. Will tx for inflammation, possibly mittleschmerz, with ponstel today. ER precautions reviewed with patient.  Final Clinical Impressions(s) / UC Diagnoses   Final diagnoses:  Pelvic pain in female     Discharge Instructions      Your urine sample and pregnancy test are negative. We will call you with the results of your vaginal swab once obtained. I suspect her pelvic pain to be inflammatory in nature. Please start taking the Ponstel as needed for symptom relief. If any worsening symptoms, nausea, vomiting, fever occurs, please return to clinic or head to the ER.     ED Prescriptions     Medication Sig Dispense Auth. Provider   Mefenamic Acid 250 MG CAPS Take two caps PO x 1 dose, followed by one cap PO every 6 hours PRN pain x no more than 5 days consecutively 28 capsule Magaline Steinberg L, PA      PDMP not reviewed this encounter.   Holland Kotter, Maretta BeesWhitney L,  [place] 11/30/21 1321

## 2021-11-30 NOTE — ED Triage Notes (Signed)
PT now reports she wants a STD work up

## 2021-11-30 NOTE — Discharge Instructions (Addendum)
Your urine sample and pregnancy test are negative. We will call you with the results of your vaginal swab once obtained. I suspect her pelvic pain to be inflammatory in nature. Please start taking the Ponstel as needed for symptom relief. If any worsening symptoms, nausea, vomiting, fever occurs, please return to clinic or head to the ER.

## 2021-11-30 NOTE — ED Triage Notes (Signed)
Pt reports ABD pain since Feb 1 . Pt also has Lt groin pain. Pt reports spotting but to soon for menses.

## 2021-12-02 LAB — CERVICOVAGINAL ANCILLARY ONLY
Bacterial Vaginitis (gardnerella): POSITIVE — AB
Candida Glabrata: NEGATIVE
Candida Vaginitis: NEGATIVE
Chlamydia: POSITIVE — AB
Comment: NEGATIVE
Comment: NEGATIVE
Comment: NEGATIVE
Comment: NEGATIVE
Comment: NEGATIVE
Comment: NORMAL
Neisseria Gonorrhea: NEGATIVE
Trichomonas: POSITIVE — AB

## 2021-12-03 ENCOUNTER — Telehealth (HOSPITAL_COMMUNITY): Payer: Self-pay | Admitting: Emergency Medicine

## 2021-12-03 MED ORDER — DOXYCYCLINE HYCLATE 100 MG PO CAPS: 100.0000 mg | ORAL_CAPSULE | Freq: Two times a day (BID) | ORAL | 0 refills | Status: AC

## 2021-12-03 MED ORDER — METRONIDAZOLE 500 MG PO TABS: 500.0000 mg | ORAL_TABLET | Freq: Two times a day (BID) | ORAL | 0 refills | Status: DC

## 2021-12-27 ENCOUNTER — Other Ambulatory Visit (HOSPITAL_COMMUNITY): Payer: Self-pay

## 2021-12-27 ENCOUNTER — Other Ambulatory Visit: Payer: Self-pay

## 2021-12-27 ENCOUNTER — Ambulatory Visit (INDEPENDENT_AMBULATORY_CARE_PROVIDER_SITE_OTHER): Payer: Self-pay | Admitting: Pharmacist

## 2021-12-27 ENCOUNTER — Telehealth: Payer: Self-pay

## 2021-12-27 DIAGNOSIS — Z113 Encounter for screening for infections with a predominantly sexual mode of transmission: Secondary | ICD-10-CM

## 2021-12-27 NOTE — Telephone Encounter (Signed)
[facility] Patient Advocate Encounter ? ?Insurance verification completed.   ? ?The patient is uninsured and will need patient assistance for medication. ? ?We can complete the application and will need to meet with the patient for signatures and income documentation. ? ?Jerolyn Flenniken, CPhT ?Specialty Pharmacy Patient Advocate ?[facility] ?Phone: [phone] ?Fax:  [phone]  ?

## 2021-12-27 NOTE — Progress Notes (Signed)
? ?Date:  12/27/2021  ? ?HPI: Christina Holden is a 41 y.o. female who presents to the [facility] for STI testing. ? ?Insured   []    Uninsured  [x]   ? ?There are no problems to display for this patient. ? ? ?Patient's Medications  ?New Prescriptions  ? No medications on file  ?Previous Medications  ? ACETAMINOPHEN (TYLENOL) 500 MG TABLET    Take 2 tablets (1,000 mg total) by mouth every 6 (six) hours as needed. Body aches and fever  ? MEFENAMIC ACID 250 MG CAPS    Take two caps PO x 1 dose, followed by one cap PO every 6 hours PRN pain x no more than 5 days consecutively  ? METRONIDAZOLE (FLAGYL) 500 MG TABLET    Take 1 tablet (500 mg total) by mouth 2 (two) times daily.  ?Modified Medications  ? No medications on file  ?Discontinued Medications  ? No medications on file  ? ? ?Allergies: ?Allergies  ?Allergen Reactions  ? Rocephin [Ceftriaxone Sodium In Dextrose]   ? ? ?Past Medical History: ?No past medical history on file. ? ?Social History: ?Social History  ? ?Socioeconomic History  ? Marital status: Single  ?  Spouse name: Not on file  ? Number of children: Not on file  ? Years of education: Not on file  ? Highest education level: Not on file  ?Occupational History  ? Not on file  ?Tobacco Use  ? Smoking status: Every Day  ? Smokeless tobacco: Never  ?Vaping Use  ? Vaping Use: Never used  ?Substance and Sexual Activity  ? Alcohol use: Yes  ?  Comment: but not today  ? Drug use: Yes  ?  Types: Marijuana  ?  Comment: social  ? Sexual activity: Not on file  ?Other Topics Concern  ? Not on file  ?Social History Narrative  ? Not on file  ? ?Social Determinants of Health  ? ?Financial Resource Strain: Not on file  ?Food Insecurity: Not on file  ?Transportation Needs: Not on file  ?Physical Activity: Not on file  ?Stress: Not on file  ?Social Connections: Not on file  ? ? ?No flowsheet data found. ? ?Labs: ? ?SCr: ?No results found for: CREATININE ?HIV ?No results found for: HIV ?Hepatitis B ?No results found  for: HEPBSAB, HEPBSAG, HEPBCAB ?Hepatitis C ?No results found for: [place], HCVRNAPCRQN ?Hepatitis A ?No results found for: HAV ?RPR and STI ?No results found for: LABRPR, RPRTITER ? ?STI Results GC CT  ?11/30/2021 Negative Positive(A)  ? ? ?Assessment: ?Christina Holden presents to clinic today to screen for STIs. She was recently seen at [facility] for pelvic pain and tested positive for chlamydia, gardnerella and trichomonas. She completed treatment with doxycycline and metronidazole both for 1 week and has no further symptoms. She states she only has one sexual partner and that she remained sexually active with him while she was taking treatment for chlamydia, bacterial vaginosis, and trichomoniasis. He now tested positive for chlamydia and trichomoniasis likely from sexual intercourse with her during active infection. She requests retesting today to assess for reinfection, so will recheck for gonorrhea, chlamydia, and trichomoniasis. Will not empirically treat given lack of symptoms and recent treatment. Will reach out for treatment if results are positive.  ? ?She politely defers PrEP given she has only recently been sexually active again and is monogamous with her current partner. Educated her that she is always welcome at our clinic for PrEP care and STI screening which are all  free for her through the [facility]. Provided her with condoms today.  ? ?Plan: ?Check POC pregnancy test, trichomonas/G/C urine cytologies ?Follow-up as needed ? ?Alfonse Spruce, PharmD, CPP ?Clinical Pharmacist Practitioner ?Infectious Diseases Clinical Pharmacist ?[facility] ?12/27/2021, 2:26 PM ? ? ?

## 2021-12-28 LAB — TRICHOMONAS VAGINALIS, PROBE AMP: Trichomonas vaginalis RNA: NOT DETECTED

## 2021-12-28 LAB — C. TRACHOMATIS/N. GONORRHOEAE RNA
C. trachomatis RNA, TMA: NOT DETECTED
N. gonorrhoeae RNA, TMA: NOT DETECTED

## 2021-12-30 ENCOUNTER — Encounter: Payer: Self-pay | Admitting: Pharmacist

## 2022-05-27 ENCOUNTER — Encounter (HOSPITAL_COMMUNITY): Payer: Self-pay

## 2022-05-27 ENCOUNTER — Ambulatory Visit (HOSPITAL_COMMUNITY)
Admission: EM | Admit: 2022-05-27 | Discharge: 2022-05-27 | Disposition: A | Discharge status: Home / Self Care | Payer: Medicaid Other | Attending: Family Medicine | Admitting: Family Medicine

## 2022-05-27 DIAGNOSIS — N946 Dysmenorrhea, unspecified: Secondary | ICD-10-CM

## 2022-05-27 DIAGNOSIS — N939 Abnormal uterine and vaginal bleeding, unspecified: Secondary | ICD-10-CM

## 2022-05-27 MED ORDER — IBUPROFEN 800 MG PO TABS: 800.0000 mg | ORAL_TABLET | Freq: Three times a day (TID) | ORAL | 0 refills | Status: DC | PRN

## 2022-05-27 MED ORDER — KETOROLAC TROMETHAMINE 30 MG/ML IJ SOLN
INTRAMUSCULAR | Status: AC
  Filled 2022-05-27: qty 1

## 2022-05-27 MED ORDER — KETOROLAC TROMETHAMINE 30 MG/ML IJ SOLN
30.0000 mg | Freq: Once | INTRAMUSCULAR | Status: AC
  Administered 2022-05-27: 30 mg via INTRAMUSCULAR

## 2022-05-27 NOTE — ED Provider Notes (Signed)
[facility]    CSN: [id number] Arrival date & time: 05/27/22  0801      History   Chief Complaint Chief Complaint  Patient presents with   Abdominal Cramping    HPI Christina Holden is a 41 y.o. female.    Abdominal Cramping   Here for left pelvic pain and cramping that began on July 28 and then worsened on July 29.  She also started her menstrual cycle on July 28.  That was on time. No fever or vomiting or diarrhea.  She has had a little nausea with the cramping.  She had similar symptoms in February.  At that time she tested positive for trichomonas and chlamydia and BV.  She states she has absolutely not had any sexual intercourse since that time.  She also went and had a test of cure about 2 weeks after she was treated and was negative for the STIs at that time  No dysuria and no vaginal discharge prior to the menses starting  She is allergic to Rocephin   History reviewed. No pertinent past medical history.  There are no problems to display for this patient.   History reviewed. No pertinent surgical history.  OB History   No obstetric history on file.      Home Medications    Prior to Admission medications   Medication Sig Start Date End Date Taking? Authorizing Provider  ibuprofen (ADVIL) 800 MG tablet Take 1 tablet (800 mg total) by mouth every 8 (eight) hours as needed (pain). 05/27/22  Yes Zenia Resides, MD  acetaminophen (TYLENOL) 500 MG tablet Take 2 tablets (1,000 mg total) by mouth every 6 (six) hours as needed. Body aches and fever 10/23/20   Rodriguez-Southworth, Nettie Elm, PA-C    Family History Family History  Problem Relation Age of Onset   Sarcoidosis Mother    Seizures Father     Social History Social History   Tobacco Use   Smoking status: Every Day   Smokeless tobacco: Never  Vaping Use   Vaping Use: Never used  Substance Use Topics   Alcohol use: Yes    Comment: but not today   Drug use: Yes    Types: Marijuana     Comment: social     Allergies   Ca phosphate-cholecalciferol, Other, and Rocephin [ceftriaxone sodium in dextrose]   Review of Systems Review of Systems   Physical Exam Triage Vital Signs ED Triage Vitals  Enc Vitals Group     BP 05/27/22 0814 109/79     Pulse Rate 05/27/22 0814 92     Resp 05/27/22 0814 16     Temp 05/27/22 0814 98.3 F (36.8 C)     Temp Source 05/27/22 0814 Oral     SpO2 05/27/22 0814 98 %     Weight 05/27/22 0813 196 lb (88.9 kg)     Height 05/27/22 0813 5\' 3"  (1.6 m)     Head Circumference --      Peak Flow --      Pain Score 05/27/22 0813 10     Pain Loc --      Pain Edu? --      Excl. in GC? --    No data found.  Updated Vital Signs BP 109/79 (BP Location: Right Arm)   Pulse 92   Temp 98.3 F (36.8 C) (Oral)   Resp 16   Ht 5\' 3"  (1.6 m)   Wt 88.9 kg   LMP 05/23/2022 (Exact Date)  SpO2 98%   BMI 34.72 kg/m   Visual Acuity Right Eye Distance:   Left Eye Distance:   Bilateral Distance:    Right Eye Near:   Left Eye Near:    Bilateral Near:     Physical Exam Vitals reviewed.  Constitutional:      General: She is not in acute distress.    Appearance: She is not ill-appearing, toxic-appearing or diaphoretic.  HENT:     Mouth/Throat:     Mouth: Mucous membranes are moist.     Pharynx: No oropharyngeal exudate or posterior oropharyngeal erythema.  Eyes:     Extraocular Movements: Extraocular movements intact.     Conjunctiva/sclera: Conjunctivae normal.     Pupils: Pupils are equal, round, and reactive to light.  Cardiovascular:     Rate and Rhythm: Normal rate and regular rhythm.     Heart sounds: No murmur heard. Pulmonary:     Effort: Pulmonary effort is normal.     Breath sounds: Normal breath sounds.  Abdominal:     General: There is no distension.     Palpations: Abdomen is soft. There is no mass.     Tenderness: There is abdominal tenderness (LLQ and suprapubic). There is no guarding.  Musculoskeletal:     Cervical  back: Neck supple.  Lymphadenopathy:     Cervical: No cervical adenopathy.  Neurological:     Mental Status: She is alert.      UC Treatments / Results  Labs (all labs ordered are listed, but only abnormal results are displayed) Labs Reviewed - No data to display  EKG   Radiology No results found.  Procedures Procedures (including critical care time)  Medications Ordered in UC Medications  ketorolac (TORADOL) 30 MG/ML injection 30 mg (has no administration in time range)    Initial Impression / Assessment and Plan / UC Course  I have reviewed the triage vital signs and the nursing notes.  Pertinent labs & imaging results that were available during my care of the patient were reviewed by me and considered in my medical decision making (see chart for details).     We decided not to do a UPT or a vaginal swab, since she has had a test of cure and is that not any intercourse since February.  We will treat for the pain and cramps.  She is about to have insurance at the end of the month, and she is given contact information for OB/GYN Final Clinical Impressions(s) / UC Diagnoses   Final diagnoses:  Dysmenorrhea  Abnormal uterine bleeding     Discharge Instructions      You have been given a shot of Toradol 30 mg today.  Take ibuprofen 800 mg--1 tab every 8 hours as needed for pain.       ED Prescriptions     Medication Sig Dispense Auth. Provider   ibuprofen (ADVIL) 800 MG tablet Take 1 tablet (800 mg total) by mouth every 8 (eight) hours as needed (pain). 21 tablet Ruqayya Ventress, Janace Aris, MD      PDMP not reviewed this encounter.   Zenia Resides, MD 05/27/22 [id number]

## 2022-05-27 NOTE — Discharge Instructions (Addendum)
You have been given a shot of Toradol 30 mg today.   Take ibuprofen 800 mg--1 tab every 8 hours as needed for pain.    

## 2022-05-27 NOTE — ED Triage Notes (Signed)
Patient states she is having severe abdominal cramps onset of Friday and worse since Saturday. Patient states she has a history of severe menstrual cramps but it has been a while.   Patient has tried heat pads, Midol, and other OTC medications but nothing is helping.   Patient having heavy menstrual bleeding as well. States she does have a history heavy periods but this one is worse.

## 2022-06-12 ENCOUNTER — Ambulatory Visit (HOSPITAL_COMMUNITY)
Admission: EM | Admit: 2022-06-12 | Discharge: 2022-06-12 | Disposition: A | Discharge status: Home / Self Care | Payer: Medicaid Other | Attending: Physician Assistant | Admitting: Physician Assistant

## 2022-06-12 ENCOUNTER — Encounter (HOSPITAL_COMMUNITY): Payer: Self-pay

## 2022-06-12 DIAGNOSIS — K0889 Other specified disorders of teeth and supporting structures: Secondary | ICD-10-CM

## 2022-06-12 DIAGNOSIS — K047 Periapical abscess without sinus: Secondary | ICD-10-CM

## 2022-06-12 MED ORDER — CLINDAMYCIN HCL 300 MG PO CAPS: 300.0000 mg | ORAL_CAPSULE | Freq: Three times a day (TID) | ORAL | 0 refills | Status: DC

## 2022-06-12 NOTE — Discharge Instructions (Signed)
It appears you have a dental infection.  Start clindamycin 3 times daily.  This can upset your stomach so take with food.  If you have any severe abdominal pain or diarrhea please stop the medication to be seen immediately.  Use ibuprofen 800 mg as needed for pain relief.  Gargle with warm salt water.  Follow-up with a dentist as we discussed.  If you have any swelling of your throat, difficulty breathing, change in your voice, shortness of breath you need to go to the emergency room immediately.

## 2022-06-12 NOTE — ED Provider Notes (Signed)
[facility]    CSN: [id number] Arrival date & time: 06/12/22  0810      History   Chief Complaint Chief Complaint  Patient presents with   Dental Pain    HPI Christina Holden is a 41 y.o. female.   Patient presents today with a weeklong history of left upper molar pain that has significantly worsened in the past several days.  She has a known broken tooth in this area that has been present for approximately 1 year.  She is in the process of establishing dental care to have this addressed but has not been able to see a dentist recently.  Denies any recent dental procedure.  Denies any recent antibiotics.  She has been taking Tylenol ibuprofen with temporary relief of symptoms.  Denies any swelling of her throat, muffled voice, dysphagia, odynophagia, fever, nausea, vomiting.  She reports pain is rated 10 on a 0-10 pain scale, localized to affected area, described as throbbing with periodic sharp pains, worse with mastication, relieving factors identified.  She did miss work as result of symptoms.    History reviewed. No pertinent past medical history.  There are no problems to display for this patient.   History reviewed. No pertinent surgical history.  OB History   No obstetric history on file.      Home Medications    Prior to Admission medications   Medication Sig Start Date End Date Taking? Authorizing Provider  clindamycin (CLEOCIN) 300 MG capsule Take 1 capsule (300 mg total) by mouth 3 (three) times daily. 06/12/22  Yes Shakil Dirk, Noberto Retort, PA-C  acetaminophen (TYLENOL) 500 MG tablet Take 2 tablets (1,000 mg total) by mouth every 6 (six) hours as needed. Body aches and fever 10/23/20   Rodriguez-Southworth, Nettie Elm, PA-C  ibuprofen (ADVIL) 800 MG tablet Take 1 tablet (800 mg total) by mouth every 8 (eight) hours as needed (pain). 05/27/22   Zenia Resides, MD    Family History Family History  Problem Relation Age of Onset   Sarcoidosis Mother    Seizures  Father     Social History Social History   Tobacco Use   Smoking status: Every Day   Smokeless tobacco: Never  Vaping Use   Vaping Use: Never used  Substance Use Topics   Alcohol use: Yes    Comment: but not today   Drug use: Yes    Types: Marijuana    Comment: social     Allergies   Ca phosphate-cholecalciferol, Other, and Rocephin [ceftriaxone sodium in dextrose]   Review of Systems Review of Systems  Constitutional:  Positive for activity change. Negative for appetite change, fatigue and fever.  HENT:  Positive for dental problem. Negative for congestion, facial swelling, sore throat, trouble swallowing and voice change.   Respiratory:  Negative for cough and shortness of breath.   Cardiovascular:  Negative for chest pain.  Gastrointestinal:  Negative for abdominal pain, diarrhea, nausea and vomiting.     Physical Exam Triage Vital Signs ED Triage Vitals  Enc Vitals Group     BP 06/12/22 0817 117/88     Pulse Rate 06/12/22 0817 83     Resp 06/12/22 0817 16     Temp 06/12/22 0817 98.5 F (36.9 C)     Temp src --      SpO2 06/12/22 0817 97 %     Weight --      Height --      Head Circumference --  Peak Flow --      Pain Score 06/12/22 0818 10     Pain Loc --      Pain Edu? --      Excl. in GC? --    No data found.  Updated Vital Signs BP 117/88 (BP Location: Right Arm)   Pulse 83   Temp 98.5 F (36.9 C)   Resp 16   LMP 05/23/2022 (Exact Date)   SpO2 97%   Visual Acuity Right Eye Distance:   Left Eye Distance:   Bilateral Distance:    Right Eye Near:   Left Eye Near:    Bilateral Near:     Physical Exam Vitals reviewed.  Constitutional:      General: She is awake. She is not in acute distress.    Appearance: Normal appearance. She is well-developed. She is not ill-appearing.     Comments: Very pleasant female appears stated age in no acute distress sitting comfortably in exam room  HENT:     Head: Normocephalic and atraumatic.      Nose: Nose normal.     Mouth/Throat:     Dentition: Abnormal dentition. Gingival swelling and dental abscesses present.     Pharynx: Uvula midline. No oropharyngeal exudate or posterior oropharyngeal erythema.      Comments: No evidence of Ludwig angina Cardiovascular:     Rate and Rhythm: Normal rate and regular rhythm.     Heart sounds: Normal heart sounds, S1 normal and S2 normal. No murmur heard. Pulmonary:     Effort: Pulmonary effort is normal.     Breath sounds: Normal breath sounds. No wheezing, rhonchi or rales.     Comments: Clear to auscultation bilaterally Abdominal:     General: Bowel sounds are normal.     Palpations: Abdomen is soft.     Tenderness: There is no abdominal tenderness. There is no right CVA tenderness, left CVA tenderness, guarding or rebound.  Psychiatric:        Behavior: Behavior is cooperative.      UC Treatments / Results  Labs (all labs ordered are listed, but only abnormal results are displayed) Labs Reviewed - No data to display  EKG   Radiology No results found.  Procedures Procedures (including critical care time)  Medications Ordered in UC Medications - No data to display  Initial Impression / Assessment and Plan / UC Course  I have reviewed the triage vital signs and the nursing notes.  Pertinent labs & imaging results that were available during my care of the patient were reviewed by me and considered in my medical decision making (see chart for details).     Patient reports that her reaction to cephalosporins is "her whole body feeling hot and burning".  We will use clindamycin given this history of reaction for dental abscess on exam.  Recommended that she gargle with warm salt water and alternate Tylenol and ibuprofen.  Offered prescription for ibuprofen 100 mg but she reports she has this at home and will use this as needed for pain relief.  Discussed that ultimately she will need to see a dentist and was given contact  information for local provider with instruction to call to schedule appointment.  If she has any worsening symptoms including fever, dysphagia, odynophagia, muffled voice, swelling of her throat, shortness of breath she is to go to the emergency room to which she expressed understanding.  Strict return precautions given.  Work excuse note provided.  Final Clinical Impressions(s) / UC  Diagnoses   Final diagnoses:  Dental infection  Pain, dental     Discharge Instructions      It appears you have a dental infection.  Start clindamycin 3 times daily.  This can upset your stomach so take with food.  If you have any severe abdominal pain or diarrhea please stop the medication to be seen immediately.  Use ibuprofen 800 mg as needed for pain relief.  Gargle with warm salt water.  Follow-up with a dentist as we discussed.  If you have any swelling of your throat, difficulty breathing, change in your voice, shortness of breath you need to go to the emergency room immediately.     ED Prescriptions     Medication Sig Dispense Auth. Provider   clindamycin (CLEOCIN) 300 MG capsule Take 1 capsule (300 mg total) by mouth 3 (three) times daily. 30 capsule Tinaya Ceballos K, PA-C      PDMP not reviewed this encounter.   Jeani Hawking, PA-C 06/12/22 5625

## 2022-06-12 NOTE — ED Triage Notes (Signed)
Pt states broken tooth upper left side causing pain and swelling since last pm.

## 2022-09-01 ENCOUNTER — Ambulatory Visit (HOSPITAL_COMMUNITY)
Admission: EM | Admit: 2022-09-01 | Discharge: 2022-09-01 | Disposition: A | Discharge status: Home / Self Care | Payer: Managed Care, Other (non HMO) | Attending: Family Medicine | Admitting: Family Medicine

## 2022-09-01 ENCOUNTER — Encounter (HOSPITAL_COMMUNITY): Payer: Self-pay | Admitting: Emergency Medicine

## 2022-09-01 DIAGNOSIS — Z1152 Encounter for screening for COVID-19: Secondary | ICD-10-CM | POA: Insufficient documentation

## 2022-09-01 DIAGNOSIS — J069 Acute upper respiratory infection, unspecified: Secondary | ICD-10-CM

## 2022-09-01 DIAGNOSIS — Z79899 Other long term (current) drug therapy: Secondary | ICD-10-CM | POA: Insufficient documentation

## 2022-09-01 MED ORDER — IBUPROFEN 800 MG PO TABS: 800.0000 mg | ORAL_TABLET | Freq: Three times a day (TID) | ORAL | 0 refills | Status: DC

## 2022-09-01 NOTE — Discharge Instructions (Addendum)
You have been tested for COVID-19 today. °If your test returns positive, you will receive a phone call from [facility] regarding your results. °Negative test results are not called. °Both positive and negative results area always visible on MyChart. °If you do not have a MyChart account, sign up instructions are provided in your discharge papers. °Please do not hesitate to contact us should you have questions or concerns. ° °

## 2022-09-01 NOTE — ED Triage Notes (Signed)
Pt reports body aches, chills, and head feeling "cloudy" since Friday. Requesting covid test.  Has been taking Robitussin with no relief.

## 2022-09-02 LAB — SARS CORONAVIRUS 2 (TAT 6-24 HRS): SARS Coronavirus 2: NEGATIVE

## 2022-09-03 NOTE — ED Provider Notes (Addendum)
  [facility]   [id number] 09/01/22 Arrival Time: 0850  ASSESSMENT & PLAN:  1. Viral upper respiratory tract infection     Discussed typical duration of likely viral illness. Viral testing negative (COVID). OTC symptom care as needed. Ibuprofen 800mg  TID with food for body aches.   Follow-up Information     [facility].   Specialty: Urgent Care Why: If worsening or failing to improve as anticipated. Contact information: [address] [place] [phone]                Reviewed expectations re: course of current medical issues. Questions answered. Outlined signs and symptoms indicating need for more acute intervention. Understanding verbalized. After Visit Summary given.   SUBJECTIVE: History from: Patient. Christina Holden is a 41 y.o. female. Reports: Pt reports body aches, chills, and head feeling "cloudy" since Friday. Requesting covid test.  Has been taking Robitussin with no relief. . Denies: difficulty breathing. Normal PO intake without n/v/d.  OBJECTIVE:  Vitals:   09/01/22 1101  BP: 112/79  Pulse: 73  Resp: 17  Temp: 98 F (36.7 C)  TempSrc: Oral  SpO2: 100%    General appearance: alert; no distress Eyes: PERRLA; EOMI; conjunctiva normal HENT: [place]; AT; with nasal congestion Neck: supple  Lungs: speaks full sentences without difficulty; unlabored Extremities: no edema Skin: warm and dry Neurologic: normal gait Psychological: alert and cooperative; normal mood and affect  Labs: Results for orders placed or performed during the hospital encounter of 09/01/22  SARS CORONAVIRUS 2 (TAT 6-24 HRS) Anterior Nasal Swab   Specimen: Anterior Nasal Swab  Result Value Ref Range   SARS Coronavirus 2 NEGATIVE NEGATIVE   Labs Reviewed  SARS CORONAVIRUS 2 (TAT 6-24 HRS)   Allergies  Allergen Reactions   Ca Phosphate-Cholecalciferol    Other    Rocephin [Ceftriaxone Sodium In  Dextrose]     History reviewed. No pertinent past medical history. Social History   Socioeconomic History   Marital status: Single    Spouse name: Not on file   Number of children: Not on file   Years of education: Not on file   Highest education level: Not on file  Occupational History   Not on file  Tobacco Use   Smoking status: Every Day   Smokeless tobacco: Never  Vaping Use   Vaping Use: Never used  Substance and Sexual Activity   Alcohol use: Yes    Comment: but not today   Drug use: Yes    Types: Marijuana    Comment: social   Sexual activity: Not on file  Other Topics Concern   Not on file  Social History Narrative   Not on file   Social Determinants of Health   Financial Resource Strain: Not on file  Food Insecurity: Not on file  Transportation Needs: Not on file  Physical Activity: Not on file  Stress: Not on file  Social Connections: Not on file  Intimate Partner Violence: Not on file   Family History  Problem Relation Age of Onset   Sarcoidosis Mother    Seizures Father    History reviewed. No pertinent surgical history.   13/06/23, MD 09/03/22 1106    13/08/23, MD 09/03/22 [phone]

## 2022-10-08 ENCOUNTER — Encounter (HOSPITAL_COMMUNITY): Payer: Self-pay

## 2022-10-08 ENCOUNTER — Ambulatory Visit (HOSPITAL_COMMUNITY)
Admission: EM | Admit: 2022-10-08 | Discharge: 2022-10-08 | Disposition: A | Discharge status: Home / Self Care | Payer: Managed Care, Other (non HMO) | Attending: Internal Medicine | Admitting: Internal Medicine

## 2022-10-08 DIAGNOSIS — N946 Dysmenorrhea, unspecified: Secondary | ICD-10-CM | POA: Diagnosis not present

## 2022-10-08 LAB — POCT URINALYSIS DIPSTICK, ED / UC
Bilirubin Urine: NEGATIVE
Glucose, UA: NEGATIVE mg/dL
Leukocytes,Ua: NEGATIVE
Nitrite: NEGATIVE
Protein, ur: NEGATIVE mg/dL
Specific Gravity, Urine: 1.025 (ref 1.005–1.030)
Urobilinogen, UA: 2 mg/dL — ABNORMAL HIGH (ref 0.0–1.0)
pH: 6 (ref 5.0–8.0)

## 2022-10-08 LAB — POC URINE PREG, ED: Preg Test, Ur: NEGATIVE

## 2022-10-08 MED ORDER — KETOROLAC TROMETHAMINE 30 MG/ML IJ SOLN
INTRAMUSCULAR | Status: AC
  Filled 2022-10-08: qty 1

## 2022-10-08 MED ORDER — KETOROLAC TROMETHAMINE 30 MG/ML IJ SOLN
30.0000 mg | Freq: Once | INTRAMUSCULAR | Status: AC
  Administered 2022-10-08: 30 mg via INTRAMUSCULAR

## 2022-10-08 NOTE — ED Triage Notes (Signed)
Pt is here for severe menstrual cramps x 2 days

## 2022-10-08 NOTE — ED Provider Notes (Signed)
[facility]   [id number] 10/08/22 Arrival Time: [id number]  ASSESSMENT & PLAN:  1. Dysmenorrhea    -UA with no signs of UTI.  Pregnancy test negative.  Will treat symptomatically with IM Toradol shot.  Recommended she go home and continue to take her ibuprofen 800 mg 3 times a day.  Recommend she keep using heat as well.  I have given her information on a PCP with [facility] [facility] to discuss treatment for dysmenorrhea.  All questions answered agrees to plan.  Meds ordered this encounter  Medications   ketorolac (TORADOL) 30 MG/ML injection 30 mg   Discharge Instructions   None     Follow-up Information     Schedule an appointment as soon as possible for a visit  with Orvis Brill, DO.   Specialty: Family Medicine Why: to discuss menstrual cramps Contact information: [place] [address] [phone]                  Reviewed expectations re: course of current medical issues. Questions answered. Outlined signs and symptoms indicating need for more acute intervention. Patient verbalized understanding. After Visit Summary given.   SUBJECTIVE: Pleasant 41 year old female comes urgent care to be evaluated for menstrual cramps.  Her period started 2 days ago.  She has a history of regular, heavy, and painful cramps since she was younger.  She usually takes Midol and uses a heating pad but that is not helping at this time.  Of note she was here 4 months ago for same complaint.  She was treated with ibuprofen and Toradol shot at that time.  [place]  She has not established with a PCP or GYN yet.  She has never been on birth control.  She denies any sexual intercourse for the last 5 months.  Denies any dysuria or vaginal discharge.  Patient's last menstrual period was 10/08/2022. History reviewed. No pertinent surgical history.   OBJECTIVE:  Vitals:   10/08/22 0929  BP: 135/87  Pulse: 85   Resp: 12  Temp: 98.3 F (36.8 C)  TempSrc: Oral  SpO2: 99%     Physical Exam Vitals reviewed.  Constitutional:      Appearance: She is not ill-appearing.  Cardiovascular:     Rate and Rhythm: Normal rate.  Pulmonary:     Effort: Pulmonary effort is normal.  Abdominal:     General: Abdomen is flat.     Palpations: Abdomen is soft.     Tenderness: There is abdominal tenderness (LLQ). There is no guarding or rebound.  Musculoskeletal:        General: Normal range of motion.  Skin:    General: Skin is warm.  Neurological:     General: No focal deficit present.     Mental Status: She is alert.  Psychiatric:        Mood and Affect: Mood normal.      Labs: Results for orders placed or performed during the hospital encounter of 09/01/22  SARS CORONAVIRUS 2 (TAT 6-24 HRS) Anterior Nasal Swab   Specimen: Anterior Nasal Swab  Result Value Ref Range   SARS Coronavirus 2 NEGATIVE NEGATIVE   Labs Reviewed  POCT URINALYSIS DIPSTICK, ED / UC  POC URINE PREG, ED    Imaging: No results found.   Allergies  Allergen Reactions   Ca Phosphate-Cholecalciferol    Other    Rocephin [Ceftriaxone Sodium In Dextrose]  History reviewed. No pertinent past medical history.  Social History   Socioeconomic History   Marital status: Single    Spouse name: Not on file   Number of children: Not on file   Years of education: Not on file   Highest education level: Not on file  Occupational History   Not on file  Tobacco Use   Smoking status: Every Day   Smokeless tobacco: Never  Vaping Use   Vaping Use: Never used  Substance and Sexual Activity   Alcohol use: Yes    Comment: but not today   Drug use: Yes    Types: Marijuana    Comment: social   Sexual activity: Not on file  Other Topics Concern   Not on file  Social History Narrative   Not on file   Social Determinants of Health   Financial Resource Strain: Not on file   Food Insecurity: Not on file  Transportation Needs: Not on file  Physical Activity: Not on file  Stress: Not on file  Social Connections: Not on file  Intimate Partner Violence: Not on file    Family History  Problem Relation Age of Onset   Sarcoidosis Mother    Seizures Father       Damarkus Balis, Baldemar Friday, MD 10/08/22 [phone]

## 2023-03-02 IMAGING — DX DG FINGER MIDDLE 2+V*L*
3 series · 3 of 3 positions shown · non-contrast
Comparison: None.

CLINICAL DATA: Left middle finger injury.  Finger closed in a door.

EXAM:
LEFT MIDDLE FINGER 2+V

[finger ap]
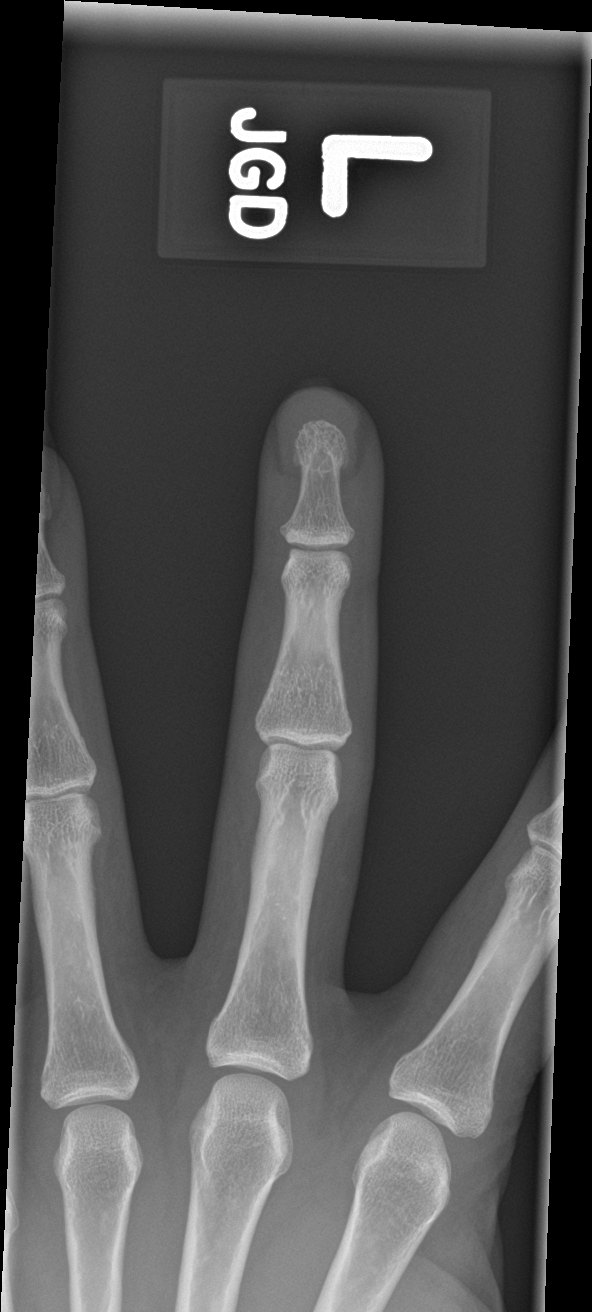

[finger obl]
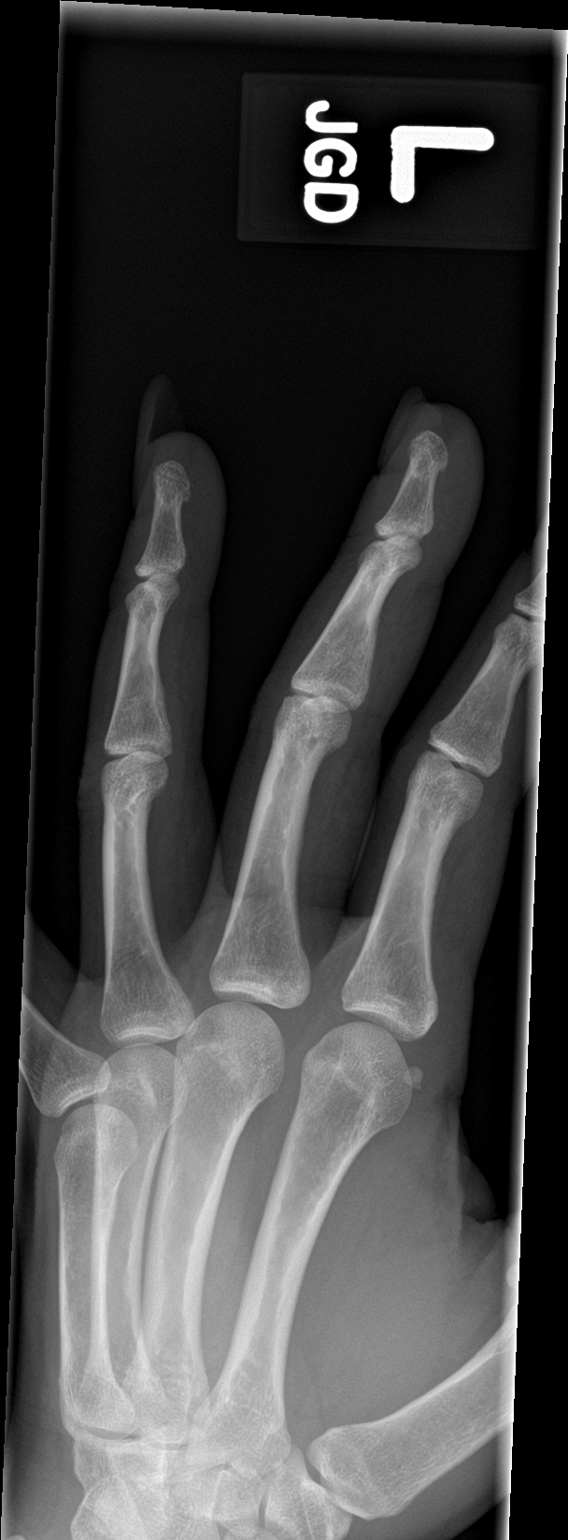

[finger lat]
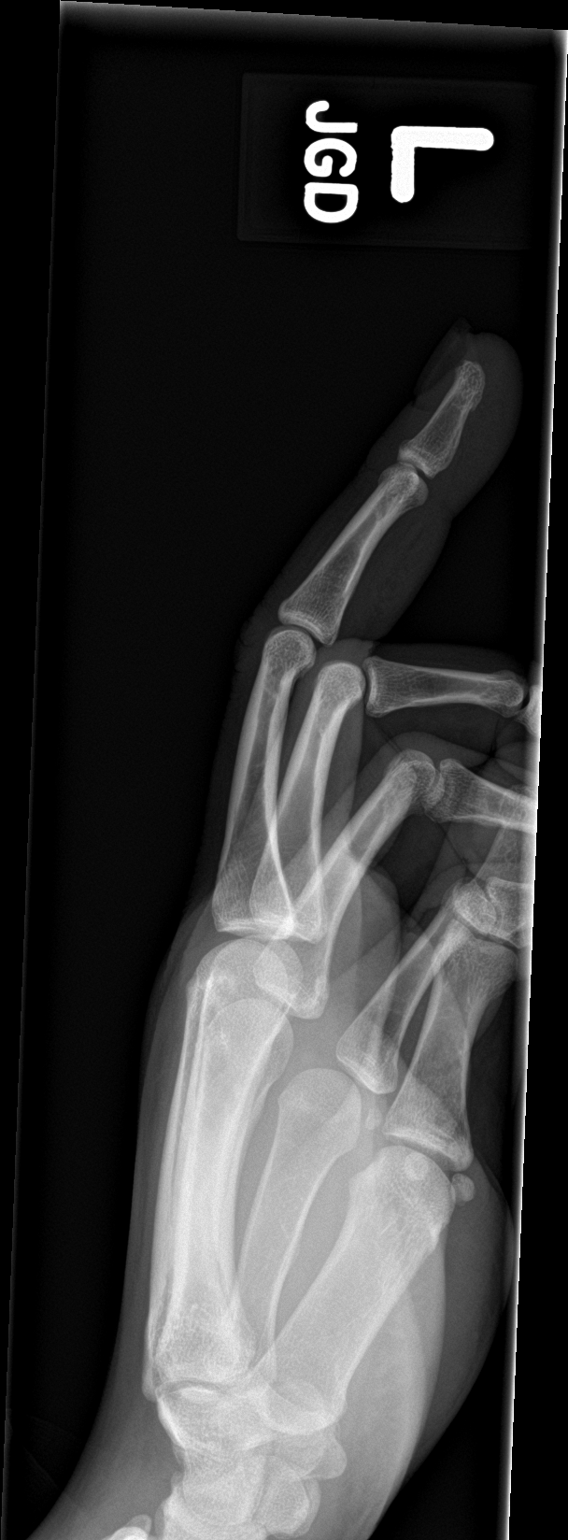

[3 of 3 positions shown; findings below may reference images not displayed]

FINDINGS: The mineralization and alignment are normal. There is no evidence of
acute fracture or dislocation. The joint spaces are preserved.
Possible mild soft tissue swelling, but no evidence of foreign body
or soft tissue emphysema.
IMPRESSION: No evidence of acute fracture, dislocation or foreign body.

## 2023-03-03 ENCOUNTER — Encounter (HOSPITAL_COMMUNITY): Payer: Self-pay

## 2023-03-03 ENCOUNTER — Ambulatory Visit (HOSPITAL_COMMUNITY)
Admission: EM | Admit: 2023-03-03 | Discharge: 2023-03-03 | Disposition: A | Discharge status: Home / Self Care | Payer: 59 | Attending: Family Medicine | Admitting: Family Medicine

## 2023-03-03 DIAGNOSIS — R102 Pelvic and perineal pain: Secondary | ICD-10-CM | POA: Diagnosis present

## 2023-03-03 LAB — POCT URINALYSIS DIP (MANUAL ENTRY)
Bilirubin, UA: NEGATIVE
Blood, UA: NEGATIVE
Glucose, UA: NEGATIVE mg/dL
Leukocytes, UA: NEGATIVE
Nitrite, UA: NEGATIVE
Protein Ur, POC: NEGATIVE mg/dL
Spec Grav, UA: 1.025 (ref 1.010–1.025)
Urobilinogen, UA: 2 E.U./dL — AB
pH, UA: 6.5 (ref 5.0–8.0)

## 2023-03-03 LAB — POCT URINE PREGNANCY: Preg Test, Ur: NEGATIVE

## 2023-03-03 MED ORDER — NAPROXEN SODIUM 550 MG PO TABS: 550.0000 mg | ORAL_TABLET | Freq: Two times a day (BID) | ORAL | 0 refills | Status: DC

## 2023-03-03 NOTE — ED Triage Notes (Addendum)
Onset of abdominal pain 2 days ago and getting worse. Patient states everything is tender/sore to the point it hurts to push the poop out.  No NKD. Patient has history of chronic loose stools.   No known sick exposure. No new foods or medications.  Patient had intercourse for the first time in months a  bout 10 days ago.

## 2023-03-03 NOTE — ED Provider Notes (Signed)
[facility]    CSN: [id number] Arrival date & time: 03/03/23  1854      History   Chief Complaint Chief Complaint  Patient presents with   Abdominal Pain    HPI Christina Holden is a 42 y.o. female.   HPI Patient presents today for evaluation of abdominal pain.  Speaking with patient further she reports the pain is mostly localized to her lower pelvic region.  Patient is currently under the care of of gynecology as she had an abnormal Pap smear and recently had a biopsy last month.  Also she  History reviewed. No pertinent past medical history.  There are no problems to display for this patient.   History reviewed. No pertinent surgical history.  OB History   No obstetric history on file.      Home Medications    Prior to Admission medications   Medication Sig Start Date End Date Taking? Authorizing Provider  norethindrone (MICRONOR) 0.35 MG tablet Take 1 tablet by mouth daily. 02/23/23  Yes [provider]  acetaminophen (TYLENOL) 500 MG tablet Take 2 tablets (1,000 mg total) by mouth every 6 (six) hours as needed. Body aches and fever 10/23/20   Rodriguez-Southworth, Nettie Elm, PA-C  clindamycin (CLEOCIN) 300 MG capsule Take 1 capsule (300 mg total) by mouth 3 (three) times daily. 06/12/22   Raspet, Noberto Retort, PA-C  ibuprofen (ADVIL) 800 MG tablet Take 1 tablet (800 mg total) by mouth 3 (three) times daily with meals. 09/01/22   Mardella Layman, MD    Family History Family History  Problem Relation Age of Onset   Sarcoidosis Mother    Seizures Father     Social History Social History   Tobacco Use   Smoking status: Every Day   Smokeless tobacco: Never  Vaping Use   Vaping Use: Never used  Substance Use Topics   Alcohol use: Yes    Comment: but not today   Drug use: Yes    Types: Marijuana    Comment: social     Allergies   Ca phosphate-cholecalciferol, Other, and Rocephin [ceftriaxone sodium in dextrose]   Review of Systems Review of  Systems Pertinent negatives listed in HPI   Physical Exam Triage Vital Signs ED Triage Vitals  Enc Vitals Group     BP 03/03/23 1909 (!) 129/93     Pulse Rate 03/03/23 1909 90     Resp 03/03/23 1909 18     Temp 03/03/23 1909 98.2 F (36.8 C)     Temp Source 03/03/23 1909 Oral     SpO2 03/03/23 1909 98 %     Weight --      Height --      Head Circumference --      Peak Flow --      Pain Score 03/03/23 1906 8     Pain Loc --      Pain Edu? --      Excl. in GC? --    No data found.  Updated Vital Signs BP (!) 129/93 (BP Location: Left Arm)   Pulse 90   Temp 98.2 F (36.8 C) (Oral)   Resp 18   LMP  (LMP Unknown)   SpO2 98%   Visual Acuity Right Eye Distance:   Left Eye Distance:   Bilateral Distance:    Right Eye Near:   Left Eye Near:    Bilateral Near:     Physical Exam   UC Treatments / Results  Labs (all labs ordered  are listed, but only abnormal results are displayed) Labs Reviewed  POCT URINALYSIS DIP (MANUAL ENTRY)  POCT URINE PREGNANCY    EKG   Radiology No results found.  Procedures Procedures (including critical care time)  Medications Ordered in UC Medications - No data to display  Initial Impression / Assessment and Plan / UC Course  I have reviewed the triage vital signs and the nursing notes.  Pertinent labs & imaging results that were available during my care of the patient were reviewed by me and considered in my medical decision making (see chart for details).     *** Final Clinical Impressions(s) / UC Diagnoses   Final diagnoses:  None   Discharge Instructions   None    ED Prescriptions   None    PDMP not reviewed this encounter.

## 2023-03-03 NOTE — Discharge Instructions (Addendum)
Your STD cytology will result within 24 no more than 48 hours.  Our office will contact you via MyChart or phone to advise if any additional treatment is needed.  If no treatment is needed please review your results on MyChart and no one from our office will contact you.  Take Naprosyn up to twice a day with food as needed for lower pelvic pain.  If symptoms worsen or do not readily improve follow-up with your gynecologist.

## 2023-03-04 ENCOUNTER — Telehealth (HOSPITAL_COMMUNITY): Payer: Self-pay

## 2023-03-04 LAB — CERVICOVAGINAL ANCILLARY ONLY
Bacterial Vaginitis (gardnerella): POSITIVE — AB
Candida Glabrata: NEGATIVE
Candida Vaginitis: NEGATIVE
Chlamydia: NEGATIVE
Comment: NEGATIVE
Comment: NEGATIVE
Comment: NEGATIVE
Comment: NEGATIVE
Comment: NEGATIVE
Comment: NORMAL
Neisseria Gonorrhea: NEGATIVE
Trichomonas: NEGATIVE

## 2023-03-04 MED ORDER — METRONIDAZOLE 500 MG PO TABS: 500.0000 mg | ORAL_TABLET | Freq: Two times a day (BID) | ORAL | 0 refills | Status: DC

## 2023-04-03 ENCOUNTER — Ambulatory Visit (HOSPITAL_COMMUNITY)
Admission: EM | Admit: 2023-04-03 | Discharge: 2023-04-03 | Disposition: A | Discharge status: Home / Self Care | Payer: 59 | Attending: Urgent Care | Admitting: Urgent Care

## 2023-04-03 ENCOUNTER — Encounter (HOSPITAL_COMMUNITY): Payer: Self-pay | Admitting: Emergency Medicine

## 2023-04-03 DIAGNOSIS — N760 Acute vaginitis: Secondary | ICD-10-CM | POA: Diagnosis not present

## 2023-04-03 DIAGNOSIS — R102 Pelvic and perineal pain: Secondary | ICD-10-CM | POA: Diagnosis not present

## 2023-04-03 LAB — POCT URINALYSIS DIP (MANUAL ENTRY)
Bilirubin, UA: NEGATIVE
Blood, UA: NEGATIVE
Glucose, UA: NEGATIVE mg/dL
Ketones, POC UA: NEGATIVE mg/dL
Leukocytes, UA: NEGATIVE
Nitrite, UA: NEGATIVE
Protein Ur, POC: NEGATIVE mg/dL
Spec Grav, UA: 1.025 (ref 1.010–1.025)
Urobilinogen, UA: 0.2 E.U./dL
pH, UA: 5.5 (ref 5.0–8.0)

## 2023-04-03 LAB — POCT URINE PREGNANCY: Preg Test, Ur: NEGATIVE

## 2023-04-03 MED ORDER — METRONIDAZOLE 500 MG PO TABS: 500.0000 mg | ORAL_TABLET | Freq: Two times a day (BID) | ORAL | 0 refills | Status: AC

## 2023-04-03 NOTE — ED Provider Notes (Signed)
[facility]    CSN: [id number] Arrival date & time: 04/03/23  0802      History   Chief Complaint Chief Complaint  Patient presents with   Abdominal Pain    HPI Christina Holden is a 42 y.o. female.   Pleasant 42 year old female presents today due to concerns of pelvic pressure.  She states it is a crampy dull pain that is been persistent for the past several days.  She compares the pelvic pressure to "when you have sex with someone that is too big".  She denies any sexual intercourse recently however.  She is currently on her menstrual period, has had 2 in the past 1 month.  She does report a recurrent history of bacterial vaginosis, states that this feels very similar to her prior episodes.  She does report some urinary frequency, but no dysuria.  She reports a chronic history of diarrhea since childhood, unchanged from her baseline.  No upper abdominal pain and no constipation.  No nausea, vomiting, fever.  She has not tried any medications for her symptoms.  She denies any rash, lesions, or itching.   Abdominal Pain   History reviewed. No pertinent past medical history.  There are no problems to display for this patient.   History reviewed. No pertinent surgical history.  OB History   No obstetric history on file.      Home Medications    Prior to Admission medications   Medication Sig Start Date End Date Taking? Authorizing Provider  ARIPiprazole (ABILIFY) 10 MG tablet Take 10 mg by mouth daily. 03/06/23  Yes [provider]  metroNIDAZOLE (FLAGYL) 500 MG tablet Take 1 tablet (500 mg total) by mouth 2 (two) times daily for 7 days. 04/03/23 04/10/23 Yes Glynna Failla L, PA  acetaminophen (TYLENOL) 500 MG tablet Take 2 tablets (1,000 mg total) by mouth every 6 (six) hours as needed. Body aches and fever 10/23/20   Rodriguez-Southworth, Nettie Elm, PA-C  naproxen sodium (ANAPROX DS) 550 MG tablet Take 1 tablet (550 mg total) by mouth 2 (two) times daily with a  meal. 03/03/23   Bing Neighbors, NP  norethindrone (MICRONOR) 0.35 MG tablet Take 1 tablet by mouth daily. 02/23/23   [provider]    Family History Family History  Problem Relation Age of Onset   Sarcoidosis Mother    Seizures Father     Social History Social History   Tobacco Use   Smoking status: Every Day   Smokeless tobacco: Never  Vaping Use   Vaping Use: Never used  Substance Use Topics   Alcohol use: Yes    Comment: but not today   Drug use: Yes    Types: Marijuana    Comment: social     Allergies   Ca phosphate-cholecalciferol, Other, and Rocephin [ceftriaxone sodium in dextrose]   Review of Systems Review of Systems  Gastrointestinal:  Positive for abdominal pain.  As per HPI   Physical Exam Triage Vital Signs ED Triage Vitals  Enc Vitals Group     BP 04/03/23 0819 117/83     Pulse Rate 04/03/23 0819 92     Resp 04/03/23 0819 16     Temp 04/03/23 0819 98.3 F (36.8 C)     Temp Source 04/03/23 0819 Oral     SpO2 04/03/23 0819 100 %     Weight --      Height --      Head Circumference --      Peak Flow --  Pain Score 04/03/23 0817 10     Pain Loc --      Pain Edu? --      Excl. in GC? --    No data found.  Updated Vital Signs BP 117/83 (BP Location: Right Arm)   Pulse 92   Temp 98.3 F (36.8 C) (Oral)   Resp 16   LMP 03/31/2023   SpO2 100%   Visual Acuity Right Eye Distance:   Left Eye Distance:   Bilateral Distance:    Right Eye Near:   Left Eye Near:    Bilateral Near:     Physical Exam Vitals and nursing note reviewed.  Constitutional:      General: She is not in acute distress.    Appearance: Normal appearance. She is well-developed. She is not ill-appearing, toxic-appearing or diaphoretic.  HENT:     Head: Normocephalic and atraumatic.  Eyes:     Pupils: Pupils are equal, round, and reactive to light.  Cardiovascular:     Rate and Rhythm: Normal rate.     Pulses: Normal pulses.     Heart sounds:  Normal heart sounds. No murmur heard.    No friction rub. No gallop.  Pulmonary:     Effort: Pulmonary effort is normal. No respiratory distress.     Breath sounds: Normal breath sounds. No wheezing or rales.  Chest:     Chest wall: No tenderness.  Abdominal:     General: Abdomen is flat. Bowel sounds are normal. There is no distension.     Palpations: Abdomen is soft. There is no hepatomegaly, splenomegaly or mass.     Tenderness: There is no abdominal tenderness. There is no right CVA tenderness, left CVA tenderness, guarding or rebound.     Hernia: No hernia is present.  Musculoskeletal:     Right lower leg: No edema.     Left lower leg: No edema.  Skin:    General: Skin is warm.     Coloration: Skin is not jaundiced.     Findings: No bruising, erythema or rash.  Neurological:     General: No focal deficit present.     Mental Status: She is alert and oriented to person, place, and time. Mental status is at baseline.  Psychiatric:        Mood and Affect: Mood normal.      UC Treatments / Results  Labs (all labs ordered are listed, but only abnormal results are displayed) Labs Reviewed  POCT URINALYSIS DIP (MANUAL ENTRY)  POCT URINE PREGNANCY  CERVICOVAGINAL ANCILLARY ONLY    EKG   Radiology No results found.  Procedures Procedures (including critical care time)  Medications Ordered in UC Medications - No data to display  Initial Impression / Assessment and Plan / UC Course  I have reviewed the triage vital signs and the nursing notes.  Pertinent labs & imaging results that were available during my care of the patient were reviewed by me and considered in my medical decision making (see chart for details).     Pelvic pressure -this appears to be a recurrent issue.  Patient does have a gynecologist for which she is following.  She is scheduled to have a LEEP procedure done in the near future.  Her UA and pregnancy test today are negative.  Aptima swab was  collected.  Patient admits that in the past when she has had BV she has had this pelvic pressure as well.  Will defer further workup for possible pelvic  ultrasound to gynecologist. Vaginitis -patient admits symptoms consistent with prior BV infection.  Will therefore start treatment with Flagyl twice daily x 7 days.   Final Clinical Impressions(s) / UC Diagnoses   Final diagnoses:  Pelvic pressure in female  Vaginitis and vulvovaginitis     Discharge Instructions      Your urinalysis is normal, pregnancy test is negative. Given your history of BV in the past and similar symptoms in the past, I recommend we start treatment with metronidazole while we await your Aptima swab results. Do not drink alcohol while on this medication.  Please avoid any and all forms of intercourse until test results received.  Please follow-up with your gynecologist for further evaluation and workup for your recurrent BV     ED Prescriptions     Medication Sig Dispense Auth. Provider   metroNIDAZOLE (FLAGYL) 500 MG tablet Take 1 tablet (500 mg total) by mouth 2 (two) times daily for 7 days. 14 tablet Delmon Andrada L, [place]      PDMP not reviewed this encounter.   Maretta Bees, [place] 04/03/23 1424

## 2023-04-03 NOTE — Discharge Instructions (Addendum)
Your urinalysis is normal, pregnancy test is negative. Given your history of BV in the past and similar symptoms in the past, I recommend we start treatment with metronidazole while we await your Aptima swab results. Do not drink alcohol while on this medication.  Please avoid any and all forms of intercourse until test results received.  Please follow-up with your gynecologist for further evaluation and workup for your recurrent BV

## 2023-04-03 NOTE — ED Triage Notes (Signed)
Pt c/o mid abd pain that is constant since June 3rd. "Feels like I had sex but I am with women so havent had any intercourse". Reports had BV in May.  Reports has menstrual cycle twice 5/21-24, then again 6/4. Denies n/v/d. Reports when urinating has soreness. Took Ibuprofen and Naproxen.

## 2023-04-06 LAB — CERVICOVAGINAL ANCILLARY ONLY
Candida Glabrata: NEGATIVE
Candida Vaginitis: NEGATIVE
Comment: NEGATIVE
Comment: NEGATIVE
Neisseria Gonorrhea: NEGATIVE
Trichomonas: NEGATIVE

## 2023-04-07 LAB — CERVICOVAGINAL ANCILLARY ONLY
Bacterial Vaginitis (gardnerella): POSITIVE — AB
Chlamydia: NEGATIVE
Comment: NEGATIVE
Comment: NEGATIVE
Comment: NEGATIVE
Comment: NORMAL

## 2023-06-08 ENCOUNTER — Other Ambulatory Visit: Payer: Self-pay

## 2023-06-08 ENCOUNTER — Encounter (HOSPITAL_BASED_OUTPATIENT_CLINIC_OR_DEPARTMENT_OTHER): Payer: Self-pay | Admitting: Obstetrics and Gynecology

## 2023-06-08 NOTE — Progress Notes (Addendum)
Spoke w/ via phone for pre-op interview---Christina Holden needs dos---- urine pregnancy per anesthesia, surgeon orders pending              Holden results------none COVID test -----patient states asymptomatic no test needed Arrive at -------1045 on Wednesday, 06/17/23 NPO after MN NO Solid Food.  Clear liquids from MN until---0945 Med rec completed Medications to take morning of surgery -----Abilify Diabetic medication -----n/a Patient instructed no nail polish to be worn day of surgery Patient instructed to bring photo id and insurance card day of surgery Patient aware to have Driver (ride ) / caregiver    for 24 hours after surgery - boyfriend, Christina Holden [facility] Patient Special Instructions -----No marijuana or alcohol for at least 24 hours prior to surgery. Do your best not to smoke for 24 hours before surgery. Pre-Op special Instructions -----Requested orders from Dr. Richardson Dopp via Epic IB on 06/08/23. Patient verbalized understanding of instructions that were given at this phone interview. Patient denies shortness of breath, chest pain, fever, cough at this phone interview.  Patient's allergy list included Ca Phosphate-cholecalciferol. I ordered NS instead of LR because of allergy warning with LR.  On 06/08/23 phone call, patient stated that Dr. Richardson Dopp told her to stop all of her medications including Abilify a week to 10 days before surgery. I told the patient she needed to call Dr. Dawayne Patricia office and verify. I told her that didn't sound right and that stopping Abilify abruptly could cause problems. She stated that she would call Dr. Dawayne Patricia office to verify. I also sent Dr. Richardson Dopp an Epic IB message on 06/08/23 letting her know what the patient had said.

## 2023-06-16 ENCOUNTER — Other Ambulatory Visit: Payer: Self-pay | Admitting: Obstetrics and Gynecology

## 2023-06-16 DIAGNOSIS — N9489 Other specified conditions associated with female genital organs and menstrual cycle: Secondary | ICD-10-CM

## 2023-06-16 NOTE — H&P (Signed)
 Subjective:    Chief Complaint(s): Preop vt. Cervical dysplasia . Endometrial mass    HPI:      General 42 y/o presents for preop visit. Pt is schedule for a hysteroscopy D&C polypectomy and LEEP on 06/17/2023 for the management of HGSIL and endometrial mass. --- In review: Patient is currently using POP for management of menorrhagia.  She is sexually active with same-sex partner. TVUS 01/01/2023 notable for uterus measuring 8.36 x 3.56 x 4.58 cm. No uterine anomalies seen. Endometrium trilayered with questionable hyperechoic mass posterior wall measuring 0.6 x 0.6 x 0.4 cm - no blood flow noted. Total endometrial thickness 0.55 cm. Right ovary simple follicle 1.1 cm. Left ovary WNL. No adnexal mass seen. SHG on 05/11/2023 revealed likely endometrial polyp, to be confirmed w/ pathology. 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 ECC was benign. Pt had significant pain during the colposcopy. She did not tolerate the procedure well.      Current Medication:     Taking Vitamin C , Notes to Pharmacist: pt is unsure of dosage. Aleve(Naproxen Sodium) 220 MG Tablet 2 tablet with food or milk as needed Orally every 12 hrs. Ibuprofen 800 MG Tablet 1 tablet with food or milk as needed Orally every 8 hrs. Multivitamin(Multiple Vitamin) - Tablet 1 tablet Orally Once a day.     Not-Taking FeroSul(Ferrous Sulfate) 325 (65 Fe) MG Tablet TAKE 1 TABLET BY MOUTH THREE TIMES A WEEK , Notes to Pharmacist: needs appointment. Amoxicillin-Pot Clavulanate 875-125 MG Tablet 1 tablet Orally every 12 hrs. Excedrin Migraine(Aspirin-Acetaminophen-Caffeine) 250-250-65 MG Tablet 2 tablets Orally Once a day. Medication List reviewed and reconciled with the patient.    Medical History: Bipolar Depression    Allergies/Intolerance: Rocephin: Allergy - feels like body is on fire    Gyn History: Sexual  activity currently sexually active w/female.  Periods : irregular.  LMP 05/11/2023.  Denies Birth control.  Last pap smear date 11/2022 - HSIL/HPV+.  Denies Last mammogram date N/A.  H/O Abnormal pap smear 07/05/23, HGSIL, assessed with colposcopy.  H/O STD Chlamydia, Gonorrhea, HPV.     OB History: Never been pregnant  per patient.     Surgical History: L tubal early 2000's    Hospitalization: 1-2 week psychiatry hospitalization in [place]- Diagnosed with Bipolar Depression at [facility] in [place] around 04-Jul-2013    Family History: Father: deceased, epilepsy, died in 2022/07/04 of multiple myeloma Mother: deceased, sarcoidosis- died when pt was a child Sister 1: alive 40 yrs, half sibling Sister 2: alive 26 yrs, half sibling, diagnosed with Hypertension Sister 3: alive 48 yrs, half sibling, factor V Leiden, diagnosed with Diabetes 3 sister(s) - healthy.    Social History:      General Tobacco use:   cigarettes:  Current smoker 1 ppd as of 12/03/22, Frequency:  1 PPD, Tobacco history last updated  06/03/2023, Vaping  No.  Alcohol:  yes social. Caffeine:  no.   Recreational drug use:  yes marijuana socially. DIET:  no particular dietary program. Exercise:  nothing structured, works at [employer]. Marital Status:  single.   Children:  none. 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 Seat belt use:  yes.   Home smoke detector use:  yes.      ROS:      CONSTITUTIONAL Chills  No.  Fatigue  No.  Fever  No.  Night sweats  No.  Recent travel outside [place]  No.  Sweats  No.  Weight change  No.       OPHTHALMOLOGY Blurring of vision  no.  Change in vision  no.  Double vision  no.       ENT Dizziness  no.  Nose bleeds  no.  Sore throat  no.  Teeth pain  no.       ALLERGY Hives  no.       CARDIOLOGY Chest pain  no.  High blood pressure  no.  Irregular heart beat  no.  Leg edema  no.  Palpitations  no.       RESPIRATORY Shortness of breath  no.  Cough  no.  Wheezing  no.       UROLOGY Pain with urination  no.   Urinary urgency  no.  Urinary frequency  no.  Urinary incontinence  no.  Difficulty urinating  No.  Blood in urine  No.       GASTROENTEROLOGY Abdominal pain  no.  Appetite change  no.  Bloating/belching  no.  Blood in stool or on toilet paper  no.  Change in bowel movements  no.  Constipation  no.  Diarrhea  no.  Difficulty swallowing  no.  Nausea  no.       FEMALE REPRODUCTIVE Vulvar pain  no.  Vulvar rash  no.  Abnormal vaginal bleeding  no.  Breast pain  no.  Nipple discharge  no.  Pain with intercourse  no.  Pelvic pain  no.  Unusual vaginal discharge  no.  Vaginal itching  no.       MUSCULOSKELETAL Muscle aches  no.       NEUROLOGY Headache  no.  Tingling/numbness  no.  Weakness  no.       PSYCHOLOGY Depression  no.  Anxiety  no.  Nervousness  no.  Sleep disturbances  no.  Suicidal ideation  no .       ENDOCRINOLOGY Excessive thirst  no.  Excessive urination  no.  Hair loss  no.  Heat or cold intolerance  no.       HEMATOLOGY/LYMPH Abnormal bleeding  no.  Easy bruising  no.  Swollen glands  no.       DERMATOLOGY New/changing skin lesion  no.  Rash  no.  Sores  no.  Negative except as stated in HPI.   Objective:    Vitals: Wt: 212.8, Wt change: 8.7 lbs, Ht: 63.5, BMI: 37.1, Pulse sitting: 101, BP sitting: 110/78.    Past Results:    Examination:      General Examination CONSTITUTIONAL: alert, oriented, NAD.  SKIN:  moist, warm.  EYES:  Conjunctiva clear.  LUNGS: good I:E efffort noted, clear to auscultation bilaterally.  HEART: regular rate and rhythm.  ABDOMEN: soft, non-tender/non-distended, bowel sounds present.  FEMALE GENITOURINARY: normal external genitalia, labia - unremarkable, vagina - pink moist mucosa, no lesions or abnormal discharge, cervix - no discharge or lesions or CMT, adnexa - no masses or tenderness, uterus - nontender and normal size on palpation.  EXTREMITIES: no edema present.  PSYCH:  affect normal, good eye contact.     Physical Examination:       Chaperone present Chaperone present Morene Rankins 06/03/2023 08:51:51 AM >, for pelvic exam.    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 Pt is advised she will be able to return home the same day if she is doing well. Discussed risks of hysteroscopy including but not limited to infection, bleeding, possible perforation of the uterus, with the need for further surgery. Pt advised to avoid NSAIDs (Aspirin, Aleve, Advil, Ibuprofen, Motrin) from now until surgery given risk of bleeding during surgery. She may take Tylenol for pain management. She is advised to avoid eating or drinking starting midnight prior to surgery. Discussed post-surgery avoidance of driving for 24 hours or intercourse for 2 weeks after procedure.     HGSIL on cytologic smear of cervix     Notes: planning Loop electrode excisional procedure for further evaluation given benign biopsies of the ectocervix. r/b/a of surgery discussed with the patient including but not limited to infection/ bleeding / damage to surrouning organs with the need for further surgery. Pt is advised she will have to maintain pelvic rest for 4 weeks post surgery.

## 2023-06-16 NOTE — H&P (Deleted)
Subjective:    Chief Complaint(s): Preop vt. Cervical dysplasia . Endometrial mass    HPI:      General 42 y/o presents for preop visit. Pt is schedule for a hysteroscopy D&C polypectomy and LEEP on 06/17/2023 for the management of HGSIL and endometrial mass. --- In review: Patient is currently using POP for management of menorrhagia.  She is sexually active with same-sex partner. TVUS 01/01/2023 notable for uterus measuring 8.36 x 3.56 x 4.58 cm. No uterine anomalies seen. Endometrium trilayered with questionable hyperechoic mass posterior wall measuring 0.6 x 0.6 x 0.4 cm - no blood flow noted. Total endometrial thickness 0.55 cm. Right ovary simple follicle 1.1 cm. Left ovary WNL. No adnexal mass seen. SHG on 05/11/2023 revealed likely endometrial polyp, to be confirmed w/ pathology. 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 ECC was benign. Pt had significant pain during the colposcopy. She did not tolerate the procedure well.      Current Medication:     Taking Vitamin C , Notes to Pharmacist: pt is unsure of dosage. Aleve(Naproxen Sodium) 220 MG Tablet 2 tablet with food or milk as needed Orally every 12 hrs. Ibuprofen 800 MG Tablet 1 tablet with food or milk as needed Orally every 8 hrs. Multivitamin(Multiple Vitamin) - Tablet 1 tablet Orally Once a day.     Not-Taking FeroSul(Ferrous Sulfate) 325 (65 Fe) MG Tablet TAKE 1 TABLET BY MOUTH THREE TIMES A WEEK , Notes to Pharmacist: needs appointment. Amoxicillin-Pot Clavulanate 875-125 MG Tablet 1 tablet Orally every 12 hrs. Excedrin Migraine(Aspirin-Acetaminophen-Caffeine) 250-250-65 MG Tablet 2 tablets Orally Once a day. Medication List reviewed and reconciled with the patient.    Medical History: Bipolar Depression    Allergies/Intolerance: Rocephin: Allergy - feels like body is on fire    Gyn History: Sexual  activity currently sexually active w/female.  Periods : irregular.  LMP 05/11/2023.  Denies Birth control.  Last pap smear date 11/2022 - HSIL/HPV+.  Denies Last mammogram date N/A.  H/O Abnormal pap smear 07/05/23, HGSIL, assessed with colposcopy.  H/O STD Chlamydia, Gonorrhea, HPV.     OB History: Never been pregnant  per patient.     Surgical History: L tubal early 2000's    Hospitalization: 1-2 week psychiatry hospitalization in [place]- Diagnosed with Bipolar Depression at [facility] in [place] around 04-Jul-2013    Family History: Father: deceased, epilepsy, died in 2022/07/04 of multiple myeloma Mother: deceased, sarcoidosis- died when pt was a child Sister 1: alive 40 yrs, half sibling Sister 2: alive 26 yrs, half sibling, diagnosed with Hypertension Sister 3: alive 48 yrs, half sibling, factor V Leiden, diagnosed with Diabetes 3 sister(s) - healthy.    Social History:      General Tobacco use:   cigarettes:  Current smoker 1 ppd as of 12/03/22, Frequency:  1 PPD, Tobacco history last updated  06/03/2023, Vaping  No.  Alcohol:  yes social. Caffeine:  no.   Recreational drug use:  yes marijuana socially. DIET:  no particular dietary program. Exercise:  nothing structured, works at [employer]. Marital Status:  single.   Children:  none. 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 Seat belt use:  yes.   Home smoke detector use:  yes.      ROS:      CONSTITUTIONAL Chills  No.  Fatigue  No.  Fever  No.  Night sweats  No.  Recent travel outside [place]  No.  Sweats  No.  Weight change  No.       OPHTHALMOLOGY Blurring of vision  no.  Change in vision  no.  Double vision  no.       ENT Dizziness  no.  Nose bleeds  no.  Sore throat  no.  Teeth pain  no.       ALLERGY Hives  no.       CARDIOLOGY Chest pain  no.  High blood pressure  no.  Irregular heart beat  no.  Leg edema  no.  Palpitations  no.       RESPIRATORY Shortness of breath  no.  Cough  no.  Wheezing  no.       UROLOGY Pain with urination  no.   Urinary urgency  no.  Urinary frequency  no.  Urinary incontinence  no.  Difficulty urinating  No.  Blood in urine  No.       GASTROENTEROLOGY Abdominal pain  no.  Appetite change  no.  Bloating/belching  no.  Blood in stool or on toilet paper  no.  Change in bowel movements  no.  Constipation  no.  Diarrhea  no.  Difficulty swallowing  no.  Nausea  no.       FEMALE REPRODUCTIVE Vulvar pain  no.  Vulvar rash  no.  Abnormal vaginal bleeding  no.  Breast pain  no.  Nipple discharge  no.  Pain with intercourse  no.  Pelvic pain  no.  Unusual vaginal discharge  no.  Vaginal itching  no.       MUSCULOSKELETAL Muscle aches  no.       NEUROLOGY Headache  no.  Tingling/numbness  no.  Weakness  no.       PSYCHOLOGY Depression  no.  Anxiety  no.  Nervousness  no.  Sleep disturbances  no.  Suicidal ideation  no .       ENDOCRINOLOGY Excessive thirst  no.  Excessive urination  no.  Hair loss  no.  Heat or cold intolerance  no.       HEMATOLOGY/LYMPH Abnormal bleeding  no.  Easy bruising  no.  Swollen glands  no.       DERMATOLOGY New/changing skin lesion  no.  Rash  no.  Sores  no.  Negative except as stated in HPI.   Objective:    Vitals: Wt: 212.8, Wt change: 8.7 lbs, Ht: 63.5, BMI: 37.1, Pulse sitting: 101, BP sitting: 110/78.    Past Results:    Examination:      General Examination CONSTITUTIONAL: alert, oriented, NAD.  SKIN:  moist, warm.  EYES:  Conjunctiva clear.  LUNGS: good I:E efffort noted, clear to auscultation bilaterally.  HEART: regular rate and rhythm.  ABDOMEN: soft, non-tender/non-distended, bowel sounds present.  FEMALE GENITOURINARY: normal external genitalia, labia - unremarkable, vagina - pink moist mucosa, no lesions or abnormal discharge, cervix - no discharge or lesions or CMT, adnexa - no masses or tenderness, uterus - nontender and normal size on palpation.  EXTREMITIES: no edema present.  PSYCH:  affect normal, good eye contact.     Physical Examination:       Chaperone present Chaperone present Morene Rankins 06/03/2023 08:51:51 AM >, for pelvic exam.    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 Pt is advised she will be able to return home the same day if she is doing well. Discussed risks of hysteroscopy including but not limited to infection, bleeding, possible perforation of the uterus, with the need for further surgery. Pt advised to avoid NSAIDs (Aspirin, Aleve, Advil, Ibuprofen, Motrin) from now until surgery given risk of bleeding during surgery. She may take Tylenol for pain management. She is advised to avoid eating or drinking starting midnight prior to surgery. Discussed post-surgery avoidance of driving for 24 hours or intercourse for 2 weeks after procedure.     HGSIL on cytologic smear of cervix     Notes: planning Loop electrode excisional procedure for further evaluation given benign biopsies of the ectocervix. r/b/a of surgery discussed with the patient including but not limited to infection/ bleeding / damage to surrouning organs with the need for further surgery. Pt is advised she will have to maintain pelvic rest for 4 weeks post surgery.

## 2023-06-17 ENCOUNTER — Ambulatory Visit (HOSPITAL_BASED_OUTPATIENT_CLINIC_OR_DEPARTMENT_OTHER): Payer: 59 | Admitting: Anesthesiology

## 2023-06-17 ENCOUNTER — Ambulatory Visit (HOSPITAL_BASED_OUTPATIENT_CLINIC_OR_DEPARTMENT_OTHER)
Admission: RE | Admit: 2023-06-17 | Discharge: 2023-06-17 | Disposition: A | Discharge status: Home / Self Care | Payer: 59 | Attending: Obstetrics and Gynecology | Admitting: Obstetrics and Gynecology

## 2023-06-17 ENCOUNTER — Encounter (HOSPITAL_BASED_OUTPATIENT_CLINIC_OR_DEPARTMENT_OTHER)
Admission: RE | Disposition: A | Discharge status: Home / Self Care | Payer: Self-pay | Source: Home / Self Care | Attending: Obstetrics and Gynecology

## 2023-06-17 ENCOUNTER — Encounter (HOSPITAL_BASED_OUTPATIENT_CLINIC_OR_DEPARTMENT_OTHER): Payer: Self-pay | Admitting: Obstetrics and Gynecology

## 2023-06-17 ENCOUNTER — Other Ambulatory Visit: Payer: Self-pay

## 2023-06-17 DIAGNOSIS — N870 Mild cervical dysplasia: Secondary | ICD-10-CM | POA: Diagnosis present

## 2023-06-17 DIAGNOSIS — D069 Carcinoma in situ of cervix, unspecified: Secondary | ICD-10-CM

## 2023-06-17 DIAGNOSIS — F17210 Nicotine dependence, cigarettes, uncomplicated: Secondary | ICD-10-CM | POA: Insufficient documentation

## 2023-06-17 DIAGNOSIS — N858 Other specified noninflammatory disorders of uterus: Secondary | ICD-10-CM | POA: Diagnosis not present

## 2023-06-17 DIAGNOSIS — R87613 High grade squamous intraepithelial lesion on cytologic smear of cervix (HGSIL): Secondary | ICD-10-CM | POA: Diagnosis present

## 2023-06-17 DIAGNOSIS — Z01818 Encounter for other preprocedural examination: Secondary | ICD-10-CM

## 2023-06-17 DIAGNOSIS — F1290 Cannabis use, unspecified, uncomplicated: Secondary | ICD-10-CM | POA: Insufficient documentation

## 2023-06-17 DIAGNOSIS — N840 Polyp of corpus uteri: Secondary | ICD-10-CM | POA: Insufficient documentation

## 2023-06-17 DIAGNOSIS — N9489 Other specified conditions associated with female genital organs and menstrual cycle: Secondary | ICD-10-CM

## 2023-06-17 HISTORY — PX: DILATATION & CURETTAGE/HYSTEROSCOPY WITH MYOSURE: SHX6511

## 2023-06-17 HISTORY — DX: Anxiety disorder, unspecified: F41.9

## 2023-06-17 HISTORY — PX: LEEP: SHX91

## 2023-06-17 HISTORY — DX: Presence of spectacles and contact lenses: Z97.3

## 2023-06-17 HISTORY — DX: Bipolar disorder, unspecified: F31.9

## 2023-06-17 LAB — CBC
HCT: 39.8 % (ref 36.0–46.0)
Hemoglobin: 11.4 g/dL — ABNORMAL LOW (ref 12.0–15.0)
MCH: 23.7 pg — ABNORMAL LOW (ref 26.0–34.0)
MCHC: 28.6 g/dL — ABNORMAL LOW (ref 30.0–36.0)
MCV: 82.6 fL (ref 80.0–100.0)
Platelets: 258 10*3/uL (ref 150–400)
RBC: 4.82 MIL/uL (ref 3.87–5.11)
RDW: 13.6 % (ref 11.5–15.5)
WBC: 10 10*3/uL (ref 4.0–10.5)
nRBC: 0 % (ref 0.0–0.2)

## 2023-06-17 LAB — BASIC METABOLIC PANEL
Anion gap: 6 (ref 5–15)
BUN: 12 mg/dL (ref 6–20)
CO2: 22 mmol/L (ref 22–32)
Calcium: 8.1 mg/dL — ABNORMAL LOW (ref 8.9–10.3)
Chloride: 108 mmol/L (ref 98–111)
Creatinine, Ser: 0.87 mg/dL (ref 0.44–1.00)
GFR, Estimated: >60 mL/min (ref >60)
Glucose, Bld: 87 mg/dL (ref 70–99)
Potassium: 4.3 mmol/L (ref 3.5–5.1)
Sodium: 136 mmol/L (ref 135–145)

## 2023-06-17 LAB — POCT PREGNANCY, URINE: Preg Test, Ur: NEGATIVE

## 2023-06-17 SURGERY — DILATATION & CURETTAGE/HYSTEROSCOPY WITH MYOSURE
Anesthesia: General | Site: Vagina

## 2023-06-17 MED ORDER — PROPOFOL 10 MG/ML IV BOLUS
INTRAVENOUS | Status: AC
  Filled 2023-06-17: qty 20

## 2023-06-17 MED ORDER — PHENYLEPHRINE 80 MCG/ML (10ML) SYRINGE FOR IV PUSH (FOR BLOOD PRESSURE SUPPORT)
PREFILLED_SYRINGE | INTRAVENOUS | Status: DC | PRN
  Administered 2023-06-17: 160 ug via INTRAVENOUS
  Administered 2023-06-17: 80 ug via INTRAVENOUS

## 2023-06-17 MED ORDER — BUPIVACAINE HCL (PF) 0.25 % IJ SOLN
INTRAMUSCULAR | Status: DC | PRN
  Administered 2023-06-17: 20 mL

## 2023-06-17 MED ORDER — SODIUM CHLORIDE 0.9 % IR SOLN
Status: DC | PRN
  Administered 2023-06-17: 3000 mL

## 2023-06-17 MED ORDER — SODIUM CHLORIDE 0.9 % IV SOLN: INTRAVENOUS | Status: DC

## 2023-06-17 MED ORDER — MONSELS FERRIC SUBSULFATE EX SOLN
CUTANEOUS | Status: DC | PRN
  Administered 2023-06-17: 1 via TOPICAL

## 2023-06-17 MED ORDER — AMISULPRIDE (ANTIEMETIC) 5 MG/2ML IV SOLN
10.0000 mg | Freq: Once | INTRAVENOUS | Status: AC | PRN
  Administered 2023-06-17: 10 mg via INTRAVENOUS

## 2023-06-17 MED ORDER — PROPOFOL 10 MG/ML IV BOLUS
INTRAVENOUS | Status: DC | PRN
Start: 2023-06-17 — End: 2023-06-17
  Administered 2023-06-17: 200 mg via INTRAVENOUS
  Administered 2023-06-17: 70 mg via INTRAVENOUS

## 2023-06-17 MED ORDER — KETOROLAC TROMETHAMINE 30 MG/ML IJ SOLN
30.0000 mg | Freq: Once | INTRAMUSCULAR | Status: AC | PRN
  Administered 2023-06-17: 30 mg via INTRAVENOUS

## 2023-06-17 MED ORDER — MIDAZOLAM HCL 2 MG/2ML IJ SOLN
INTRAMUSCULAR | Status: AC
  Filled 2023-06-17: qty 2

## 2023-06-17 MED ORDER — LIDOCAINE 2% (20 MG/ML) 5 ML SYRINGE
INTRAMUSCULAR | Status: DC | PRN
  Administered 2023-06-17: 60 mg via INTRAVENOUS

## 2023-06-17 MED ORDER — ONDANSETRON HCL 4 MG/2ML IJ SOLN
INTRAMUSCULAR | Status: DC | PRN
Start: 2023-06-17 — End: 2023-06-17
  Administered 2023-06-17: 4 mg via INTRAVENOUS

## 2023-06-17 MED ORDER — MIDAZOLAM HCL 5 MG/5ML IJ SOLN
INTRAMUSCULAR | Status: DC | PRN
  Administered 2023-06-17: 2 mg via INTRAVENOUS

## 2023-06-17 MED ORDER — OXYCODONE HCL 5 MG PO TABS
ORAL_TABLET | ORAL | Status: AC
  Filled 2023-06-17: qty 1

## 2023-06-17 MED ORDER — OXYCODONE HCL 5 MG/5ML PO SOLN: 5.0000 mg | Freq: Once | ORAL | Status: AC | PRN

## 2023-06-17 MED ORDER — OXYCODONE HCL 5 MG PO TABS: 5.0000 mg | ORAL_TABLET | Freq: Four times a day (QID) | ORAL | 0 refills | Status: DC | PRN

## 2023-06-17 MED ORDER — POVIDONE-IODINE 10 % EX SWAB: 2.0000 | Freq: Once | CUTANEOUS | Status: DC

## 2023-06-17 MED ORDER — GLYCOPYRROLATE PF 0.2 MG/ML IJ SOSY
PREFILLED_SYRINGE | INTRAMUSCULAR | Status: DC | PRN
  Administered 2023-06-17: .2 mg via INTRAVENOUS

## 2023-06-17 MED ORDER — PHENYLEPHRINE 80 MCG/ML (10ML) SYRINGE FOR IV PUSH (FOR BLOOD PRESSURE SUPPORT)
PREFILLED_SYRINGE | INTRAVENOUS | Status: AC
  Filled 2023-06-17: qty 10

## 2023-06-17 MED ORDER — AMISULPRIDE (ANTIEMETIC) 5 MG/2ML IV SOLN
INTRAVENOUS | Status: AC
  Filled 2023-06-17: qty 4

## 2023-06-17 MED ORDER — LIDOCAINE HCL (PF) 2 % IJ SOLN
INTRAMUSCULAR | Status: AC
  Filled 2023-06-17: qty 5

## 2023-06-17 MED ORDER — DEXAMETHASONE SODIUM PHOSPHATE 10 MG/ML IJ SOLN
INTRAMUSCULAR | Status: AC
  Filled 2023-06-17: qty 1

## 2023-06-17 MED ORDER — EPHEDRINE 5 MG/ML INJ
INTRAVENOUS | Status: AC
  Filled 2023-06-17: qty 5

## 2023-06-17 MED ORDER — FENTANYL CITRATE (PF) 100 MCG/2ML IJ SOLN
INTRAMUSCULAR | Status: AC
  Filled 2023-06-17: qty 2

## 2023-06-17 MED ORDER — DEXMEDETOMIDINE HCL IN NACL 80 MCG/20ML IV SOLN
INTRAVENOUS | Status: DC | PRN
  Administered 2023-06-17: 12 ug via INTRAVENOUS

## 2023-06-17 MED ORDER — DEXAMETHASONE SODIUM PHOSPHATE 10 MG/ML IJ SOLN
INTRAMUSCULAR | Status: DC | PRN
  Administered 2023-06-17: 10 mg via INTRAVENOUS

## 2023-06-17 MED ORDER — ACETAMINOPHEN 500 MG PO TABS
1000.0000 mg | ORAL_TABLET | ORAL | Status: AC
  Administered 2023-06-17: 1000 mg via ORAL

## 2023-06-17 MED ORDER — FENTANYL CITRATE (PF) 100 MCG/2ML IJ SOLN
INTRAMUSCULAR | Status: DC | PRN
  Administered 2023-06-17: 50 ug via INTRAVENOUS

## 2023-06-17 MED ORDER — EPHEDRINE SULFATE-NACL 50-0.9 MG/10ML-% IV SOSY
PREFILLED_SYRINGE | INTRAVENOUS | Status: DC | PRN
  Administered 2023-06-17: 5 mg via INTRAVENOUS

## 2023-06-17 MED ORDER — IBUPROFEN 800 MG PO TABS: 800.0000 mg | ORAL_TABLET | Freq: Three times a day (TID) | ORAL | 0 refills | Status: DC | PRN

## 2023-06-17 MED ORDER — KETOROLAC TROMETHAMINE 30 MG/ML IJ SOLN
INTRAMUSCULAR | Status: AC
  Filled 2023-06-17: qty 1

## 2023-06-17 MED ORDER — OXYCODONE HCL 5 MG PO TABS
5.0000 mg | ORAL_TABLET | Freq: Once | ORAL | Status: AC | PRN
  Administered 2023-06-17: 5 mg via ORAL

## 2023-06-17 MED ORDER — ACETAMINOPHEN 500 MG PO TABS
ORAL_TABLET | ORAL | Status: AC
  Filled 2023-06-17: qty 2

## 2023-06-17 MED ORDER — PROMETHAZINE HCL 25 MG/ML IJ SOLN: 6.2500 mg | INTRAMUSCULAR | Status: DC | PRN

## 2023-06-17 MED ORDER — FENTANYL CITRATE (PF) 100 MCG/2ML IJ SOLN
25.0000 ug | INTRAMUSCULAR | Status: DC | PRN
  Administered 2023-06-17: 25 ug via INTRAVENOUS

## 2023-06-17 MED ORDER — ACETAMINOPHEN 500 MG PO TABS: 1000.0000 mg | ORAL_TABLET | Freq: Three times a day (TID) | ORAL | 0 refills | Status: DC | PRN

## 2023-06-17 MED ORDER — ONDANSETRON HCL 4 MG/2ML IJ SOLN
INTRAMUSCULAR | Status: AC
  Filled 2023-06-17: qty 2

## 2023-06-17 MED ORDER — GLYCOPYRROLATE PF 0.2 MG/ML IJ SOSY
PREFILLED_SYRINGE | INTRAMUSCULAR | Status: AC
  Filled 2023-06-17: qty 1

## 2023-06-17 SURGICAL SUPPLY — 25 items
CATH ROBINSON RED A/P 16FR (CATHETERS) IMPLANT
DEVICE MYOSURE LITE (MISCELLANEOUS) IMPLANT
DEVICE MYOSURE REACH (MISCELLANEOUS) ×1 IMPLANT
DRSG TELFA 3X8 NADH STRL (GAUZE/BANDAGES/DRESSINGS) ×2 IMPLANT
ELECT LOOP LEEP RND 20X12 WHT (CUTTING LOOP) ×3
ELECT REM PT RETURN 9FT ADLT (ELECTROSURGICAL) ×3
ELECTRODE LOOP LP RND 20X12WHT (CUTTING LOOP) ×1 IMPLANT
ELECTRODE REM PT RTRN 9FT ADLT (ELECTROSURGICAL) ×1 IMPLANT
GAUZE 4X4 16PLY ~~LOC~~+RFID DBL (SPONGE) ×2 IMPLANT
GLOVE BIOGEL M 6.5 STRL (GLOVE) ×3 IMPLANT
GLOVE BIOGEL M 7.0 STRL (GLOVE) ×3 IMPLANT
GLOVE BIOGEL PI IND STRL 6.5 (GLOVE) ×7 IMPLANT
GLOVE BIOGEL PI IND STRL 7.0 (GLOVE) ×5 IMPLANT
GOWN STRL REUS W/TWL LRG LVL3 (GOWN DISPOSABLE) ×7 IMPLANT
IV NS IRRIG 3000ML ARTHROMATIC (IV SOLUTION) ×1 IMPLANT
KIT PROCEDURE FLUENT (KITS) ×3 IMPLANT
KIT TURNOVER CYSTO (KITS) ×3 IMPLANT
PACK VAGINAL MINOR WOMEN LF (CUSTOM PROCEDURE TRAY) ×3 IMPLANT
PAD OB MATERNITY 4.3X12.25 (PERSONAL CARE ITEMS) ×3 IMPLANT
PENCIL SMOKE EVACUATOR (MISCELLANEOUS) ×1 IMPLANT
SEAL ROD LENS SCOPE MYOSURE (ABLATOR) ×3 IMPLANT
SLEEVE SCD COMPRESS KNEE MED (STOCKING) ×3 IMPLANT
SWAB OB GYN 8IN STERILE 2PK (MISCELLANEOUS) ×1 IMPLANT
TUBE CONNECTING 12X1/4 (SUCTIONS) ×1 IMPLANT
YANKAUER SUCT BULB TIP NO VENT (SUCTIONS) ×1 IMPLANT

## 2023-06-17 NOTE — Discharge Instructions (Addendum)
   D & C Home care Instructions:   Personal hygiene:  Used sanitary napkins for vaginal drainage not tampons. Shower or tub bathe the day after your procedure. No douching until bleeding stops. Always wipe from front to back after  Elimination.  Activity: Do not drive or operate any equipment today. The effects of the anesthesia are still present and drowsiness may result. Rest today, not necessarily flat bed rest, just take it easy. You may resume your normal activity in one to 2 days.  Sexual activity: No intercourse for four weeks or as indicated by your physician  Diet: Eat a light diet as desired this evening. You may resume a regular diet tomorrow.  Return to work: One to 2 days.  General Expectations of your surgery: Vaginal bleeding should be no heavier than a normal period. Spotting may continue up to 10 days. Mild cramps may continue for a couple of days. You may have a regular period in 2-6 weeks.  Unexpected observations call your doctor if these occur: persistent or heavy bleeding. Severe abdominal cramping or pain. Elevation of temperature greater than 100F. Inability to urinate.   Call for an appointment in one week.    Post Anesthesia Home Care Instructions  Activity: Get plenty of rest for the remainder of the day. A responsible individual must stay with you for 24 hours following the procedure.  For the next 24 hours, DO NOT: -Drive a car -Advertising copywriter -Drink alcoholic beverages -Take any medication unless instructed by your physician -Make any legal decisions or sign important papers.  Meals: Start with liquid foods such as gelatin or soup. Progress to regular foods as tolerated. Avoid greasy, spicy, heavy foods. If nausea and/or vomiting occur, drink only clear liquids until the nausea and/or vomiting subsides. Call your physician if vomiting continues.  Special Instructions/Symptoms: Your throat may feel dry or sore from the anesthesia or the breathing  tube placed in your throat during surgery. If this causes discomfort, gargle with warm salt water. The discomfort should disappear within 24 hours.

## 2023-06-17 NOTE — Op Note (Signed)
06/17/2023  1:51 PM  PATIENT:  Christina Holden  42 y.o. female  PRE-OPERATIVE DIAGNOSIS:  Endometrail Mass HGSIL on cytologic smear of cervix  POST-OPERATIVE DIAGNOSIS:  Endometrail MassHGSIL on cytologic smear of cervix  PROCEDURE:  Procedure(s): DILATATION & CURETTAGE/HYSTEROSCOPY WITH MYOSURE (N/A) LOOP ELECTROSURGICAL EXCISION PROCEDURE (LEEP)  SURGEON:  Surgeons and Role:    Gerald Leitz, MD - Primary  PHYSICIAN ASSISTANT:   ASSISTANTS: none   ANESTHESIA:   general  EBL:  3 mL   BLOOD ADMINISTERED:none  DRAINS: none   LOCAL MEDICATIONS USED:  MARCAINE     SPECIMEN:  Source of Specimen:  endometrial curettings and polyp . Cervical cone  DISPOSITION OF SPECIMEN:  PATHOLOGY  COUNTS:  YES  TOURNIQUET:  * No tourniquets in log *  DICTATION: .Note written in EPIC  PLAN OF CARE: Discharge to home after PACU  PATIENT DISPOSITION:  PACU - hemodynamically stable.   Delay start of Pharmacological VTE agent (>24hrs) due to surgical blood loss or risk of bleeding: not applicable  Findings: Normal appearing external genitalia vaginal mucosa and cervix. Proliferative endometrium. Anterior endometrial polyp noted.   Procedure: Patient was taken to the operating room #3 [facility] where she was placed under general anesthesia. She was placed in the dorsal lithotomy position. She was prepped and draped in the usual sterile fashion. A speculum was placed into the vaginal vault. The anterior lip of the cervix was grasped with a single-tooth tenaculum. Quarter percent Marcaine was injected at the 4 and 8:00 positions of the cervix. The cervix was then sounded to 8 cm. The cervix was dilated to approximately 6 mm. Mysosure operative  hysteroscope was inserted. The findings noted above. Myosure reach  blade was introduced throught the hysteroscope. The endometrial mass was removed in 5 minute.  There was no evidence of perforation. Hysteroscope was then removed.The single-tooth tenaculum was  removed from the anterior lip of the cervix    The loop electrode excisional procedure was performed in sterile fashion . The specimen was harvested and sent to pathology. The cervical cone bed was hemostatic. Monsels was added for continued hemostasis.   The speculum was removed from the patient's vagina. She was awakened from anesthesia taken care  To the recovery  room awake and in stable condition. Sponge lap and needle counts were correct x2.

## 2023-06-17 NOTE — Anesthesia Preprocedure Evaluation (Addendum)
Anesthesia Evaluation  Patient identified by MRN, date of birth, ID band Patient awake    Reviewed: Allergy & Precautions, NPO status , Patient's Chart, lab work & pertinent test results  Airway Mallampati: II  TM Distance: >3 FB Neck ROM: Full    Dental no notable dental hx.    Pulmonary Current SmokerPatient did not abstain from smoking.   Pulmonary exam normal        Cardiovascular negative cardio ROS Normal cardiovascular exam     Neuro/Psych  PSYCHIATRIC DISORDERS Anxiety  Bipolar Disorder   negative neurological ROS     GI/Hepatic negative GI ROS,,,(+)     substance abuse    Endo/Other  negative endocrine ROS    Renal/GU negative Renal ROS     Musculoskeletal negative musculoskeletal ROS (+)    Abdominal  (+) + obese  Peds  Hematology negative hematology ROS (+)   Anesthesia Other Findings Endometrial Mass  HGSIL on cytologic smear of cervix    Reproductive/Obstetrics Hcg negative                             Anesthesia Physical Anesthesia Plan  ASA: 2  Anesthesia Plan: General   Post-op Pain Management:    Induction: Intravenous  PONV Risk Score and Plan: 3 and Ondansetron, Dexamethasone, Midazolam and Treatment may vary due to age or medical condition  Airway Management Planned: LMA  Additional Equipment:   Intra-op Plan:   Post-operative Plan: Extubation in OR  Informed Consent: I have reviewed the patients History and Physical, chart, labs and discussed the procedure including the risks, benefits and alternatives for the proposed anesthesia with the patient or authorized representative who has indicated his/her understanding and acceptance.     Dental advisory given  Plan Discussed with: CRNA  Anesthesia Plan Comments:         Anesthesia Quick Evaluation

## 2023-06-17 NOTE — H&P (Signed)
Date of Initial H&P: 06/16/2023  History reviewed, patient examined, no change in status, stable for surgery.

## 2023-06-17 NOTE — Anesthesia Procedure Notes (Signed)
Procedure Name: LMA Insertion Date/Time: 06/17/2023 1:01 PM  Performed by: Bishop Limbo, CRNAPre-anesthesia Checklist: Patient identified, Emergency Drugs available, Suction available and Patient being monitored Patient Re-evaluated:Patient Re-evaluated prior to induction Oxygen Delivery Method: Circle System Utilized Preoxygenation: Pre-oxygenation with 100% oxygen Induction Type: IV induction Ventilation: Mask ventilation without difficulty LMA: LMA inserted LMA Size: 4.0 Number of attempts: 1 Placement Confirmation: positive ETCO2 Tube secured with: Tape Dental Injury: Teeth and Oropharynx as per pre-operative assessment

## 2023-06-17 NOTE — Transfer of Care (Signed)
Immediate Anesthesia Transfer of Care Note  Patient: Christina Holden  Procedure(s) Performed: DILATATION & CURETTAGE/HYSTEROSCOPY WITH MYOSURE (Uterus) LOOP ELECTROSURGICAL EXCISION PROCEDURE (LEEP) (Vagina )  Patient Location: PACU  Anesthesia Type:General  Level of Consciousness: awake, alert , oriented, and patient cooperative  Airway & Oxygen Therapy: Patient Spontanous Breathing and Patient connected to nasal cannula oxygen  Post-op Assessment: Report given to RN and Post -op Vital signs reviewed and stable  Post vital signs: Reviewed and stable  Last Vitals:  Vitals Value Taken Time  BP 119/75 06/17/23 1345  Temp    Pulse 92 06/17/23 1349  Resp 15 06/17/23 1349  SpO2 100 % 06/17/23 1349  Vitals shown include unfiled device data.  Last Pain:  Vitals:   06/17/23 1128  TempSrc: Oral  PainSc: 0-No pain      Patients Stated Pain Goal: 5 (06/17/23 1128)  Complications: No notable events documented.

## 2023-06-18 ENCOUNTER — Encounter (HOSPITAL_BASED_OUTPATIENT_CLINIC_OR_DEPARTMENT_OTHER): Payer: Self-pay | Admitting: Obstetrics and Gynecology

## 2023-06-18 NOTE — Anesthesia Postprocedure Evaluation (Signed)
Anesthesia Post Note  Patient: Christina Holden  Procedure(s) Performed: DILATATION & CURETTAGE/HYSTEROSCOPY WITH MYOSURE (Uterus) LOOP ELECTROSURGICAL EXCISION PROCEDURE (LEEP) (Vagina )     Patient location during evaluation: PACU Anesthesia Type: General Level of consciousness: awake Pain management: pain level controlled Vital Signs Assessment: post-procedure vital signs reviewed and stable Respiratory status: spontaneous breathing, nonlabored ventilation and respiratory function stable Cardiovascular status: blood pressure returned to baseline and stable Postop Assessment: no apparent nausea or vomiting Anesthetic complications: no   No notable events documented.  Last Vitals:  Vitals:   06/17/23 1422 06/17/23 1508  BP:  121/84  Pulse: 73 71  Resp: 18 16  Temp:  36.5 C  SpO2: 99% 100%    Last Pain:  Vitals:   06/17/23 1508  TempSrc:   PainSc: 0-No pain                 Arshia Rondon P Viviann Broyles

## 2023-06-22 LAB — SURGICAL PATHOLOGY

## 2024-03-09 ENCOUNTER — Ambulatory Visit (INDEPENDENT_AMBULATORY_CARE_PROVIDER_SITE_OTHER): Admitting: Pharmacist

## 2024-03-09 ENCOUNTER — Other Ambulatory Visit: Payer: Self-pay

## 2024-03-09 DIAGNOSIS — Z113 Encounter for screening for infections with a predominantly sexual mode of transmission: Secondary | ICD-10-CM

## 2024-03-09 NOTE — Progress Notes (Signed)
 NEW REFERRAL TO [facility]

## 2024-03-09 NOTE — Progress Notes (Signed)
   HPI: Christina Holden is a 43 y.o. female who presents to the [facility] clinic today for STI testing.  Patient Active Problem List   Diagnosis Date Noted   Endometrial mass 06/17/2023   HGSIL (high grade squamous intraepithelial lesion) on Pap smear of cervix 06/17/2023    Patient's Medications  New Prescriptions   No medications on file  Previous Medications   ACETAMINOPHEN  (TYLENOL ) 500 MG TABLET    Take 2 tablets (1,000 mg total) by mouth every 6 (six) hours as needed. Body aches and fever   ACETAMINOPHEN  (TYLENOL ) 500 MG TABLET    Take 2 tablets (1,000 mg total) by mouth every 8 (eight) hours as needed for moderate pain or mild pain.   ARIPIPRAZOLE (ABILIFY) 10 MG TABLET    Take 10 mg by mouth daily.   IBUPROFEN  (ADVIL ) 800 MG TABLET    Take 1 tablet (800 mg total) by mouth every 8 (eight) hours as needed for mild pain or moderate pain.   NAPROXEN  SODIUM (ANAPROX  DS) 550 MG TABLET    Take 1 tablet (550 mg total) by mouth 2 (two) times daily with a meal.   OXYCODONE  (OXY IR/ROXICODONE ) 5 MG IMMEDIATE RELEASE TABLET    Take 1 tablet (5 mg total) by mouth every 6 (six) hours as needed for severe pain.  Modified Medications   No medications on file  Discontinued Medications   No medications on file    Assessment: Tenia presents today for STI testing. She does not currently have any symptoms. States that she is planning to "get back out there" soon and would like to be sure she does not have any infections before this.  She is requesting HIV, syphilis, urine cytologies, and a vaginal swab today. Discussed that these will result in the next couple of days and will be visible on MyChart.  Plan: - STI screening: HIV antibody, RPR, urine GC/CT cytology, vaginal swab today - F/u results to see if treatment is needed   Valarie Garner, PharmD PGY1 Pharmacy Resident

## 2024-03-10 LAB — SURESWAB® ADVANCED VAGINITIS PLUS,TMA
C. trachomatis RNA, TMA: NOT DETECTED
CANDIDA SPECIES: DETECTED — AB
Candida glabrata: NOT DETECTED
N. gonorrhoeae RNA, TMA: NOT DETECTED
SURESWAB(R) ADV BACTERIAL VAGINOSIS(BV),TMA: POSITIVE — AB
TRICHOMONAS VAGINALIS (TV),TMA: NOT DETECTED

## 2024-03-10 LAB — RPR: RPR Ser Ql: NONREACTIVE

## 2024-03-10 LAB — HIV ANTIBODY (ROUTINE TESTING W REFLEX): HIV 1&2 Ab, 4th Generation: NONREACTIVE

## 2024-03-11 ENCOUNTER — Telehealth: Payer: Self-pay | Admitting: Pharmacist

## 2024-03-11 DIAGNOSIS — B3731 Acute candidiasis of vulva and vagina: Secondary | ICD-10-CM

## 2024-03-11 DIAGNOSIS — B9689 Other specified bacterial agents as the cause of diseases classified elsewhere: Secondary | ICD-10-CM

## 2024-03-11 LAB — C. TRACHOMATIS/N. GONORRHOEAE RNA
C. trachomatis RNA, TMA: NOT DETECTED
N. gonorrhoeae RNA, TMA: NOT DETECTED

## 2024-03-11 MED ORDER — FLUCONAZOLE 150 MG PO TABS: 150.0000 mg | ORAL_TABLET | Freq: Once | ORAL | 0 refills | Status: AC

## 2024-03-11 MED ORDER — METRONIDAZOLE 500 MG PO TABS: 500.0000 mg | ORAL_TABLET | Freq: Two times a day (BID) | ORAL | 0 refills | Status: DC

## 2024-03-11 NOTE — Telephone Encounter (Signed)
 Called patient to follow up on vaginal swab results which were positive for bacterial vaginosis and candida. 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 Counseled to take metronidazole  with food and avoid alcohol while taking and for 3 days after completing treatment. All patient's questions were answered.  Georga Killings, PharmD PGY-1 Pharmacy Resident

## 2024-04-04 ENCOUNTER — Other Ambulatory Visit: Payer: Self-pay | Admitting: Physician Assistant

## 2024-04-04 DIAGNOSIS — Z1231 Encounter for screening mammogram for malignant neoplasm of breast: Secondary | ICD-10-CM

## 2024-04-08 NOTE — Addendum Note (Signed)
 Addended by: LACOMBE, MICHAEL J on: 04/08/2024 04:41 PM  Modules accepted: Orders

## 2024-04-12 ENCOUNTER — Ambulatory Visit
Admission: RE | Admit: 2024-04-12 | Discharge: 2024-04-12 | Disposition: A | Discharge status: Home / Self Care | Source: Ambulatory Visit

## 2024-04-12 DIAGNOSIS — Z1231 Encounter for screening mammogram for malignant neoplasm of breast: Secondary | ICD-10-CM

## 2024-04-15 ENCOUNTER — Other Ambulatory Visit: Payer: Self-pay | Admitting: Physician Assistant

## 2024-04-15 DIAGNOSIS — R928 Other abnormal and inconclusive findings on diagnostic imaging of breast: Secondary | ICD-10-CM

## 2024-04-19 ENCOUNTER — Other Ambulatory Visit: Payer: Self-pay | Admitting: Physician Assistant

## 2024-04-19 ENCOUNTER — Ambulatory Visit
Admission: RE | Admit: 2024-04-19 | Discharge: 2024-04-19 | Disposition: A | Discharge status: Home / Self Care | Source: Ambulatory Visit | Attending: Physician Assistant | Admitting: Physician Assistant

## 2024-04-19 DIAGNOSIS — R928 Other abnormal and inconclusive findings on diagnostic imaging of breast: Secondary | ICD-10-CM

## 2024-04-19 DIAGNOSIS — N6489 Other specified disorders of breast: Secondary | ICD-10-CM

## 2024-04-22 ENCOUNTER — Other Ambulatory Visit

## 2024-04-22 ENCOUNTER — Encounter

## 2024-05-16 ENCOUNTER — Other Ambulatory Visit: Payer: Self-pay

## 2024-05-16 ENCOUNTER — Ambulatory Visit (INDEPENDENT_AMBULATORY_CARE_PROVIDER_SITE_OTHER): Admitting: Allergy

## 2024-05-16 ENCOUNTER — Encounter: Payer: Self-pay | Admitting: Allergy

## 2024-05-16 VITALS — BP 132/92 | HR 116 | Temp 98.8°F | Resp 18 | Ht 63.19 in | Wt 216.8 lb

## 2024-05-16 DIAGNOSIS — T781XXD Other adverse food reactions, not elsewhere classified, subsequent encounter: Secondary | ICD-10-CM

## 2024-05-16 DIAGNOSIS — J3089 Other allergic rhinitis: Secondary | ICD-10-CM | POA: Diagnosis not present

## 2024-05-16 DIAGNOSIS — R21 Rash and other nonspecific skin eruption: Secondary | ICD-10-CM | POA: Diagnosis not present

## 2024-05-16 DIAGNOSIS — J4540 Moderate persistent asthma, uncomplicated: Secondary | ICD-10-CM | POA: Diagnosis not present

## 2024-05-16 DIAGNOSIS — Z720 Tobacco use: Secondary | ICD-10-CM

## 2024-05-16 MED ORDER — CETIRIZINE HCL 10 MG PO TABS: 10.0000 mg | ORAL_TABLET | Freq: Every day | ORAL | 3 refills | Status: AC

## 2024-05-16 MED ORDER — FLUTICASONE FUROATE-VILANTEROL 100-25 MCG/ACT IN AEPB: 1.0000 | INHALATION_SPRAY | Freq: Every day | RESPIRATORY_TRACT | 1 refills | Status: AC

## 2024-05-16 NOTE — Progress Notes (Signed)
 New Patient Note  RE: [name] MRN: [id number] DOB: 1981/05/08 Date of Office Visit: 05/16/2024  Consult requested by: Sheldon Netter, PA Primary care provider: Sheldon Netter, PA  Chief Complaint: Establish Care (Allergy with rash and hives.)  History of Present Illness: I had the pleasure of seeing Christina Holden for initial evaluation at the [facility] on 05/16/2024. She is a 43 y.o. female, who is referred here by Sheldon Netter, PA for the evaluation of urticaria.  Discussed the use of AI scribe software for clinical note transcription with the patient, who gave verbal consent to proceed.    She has been experiencing recurrent rashes and itching, primarily on her hands and feet, since returning from her brother's funeral in April. The itching is severe enough to cause her skin to break open when scratched. The rashes appear as hives on her stomach and under her arms. She had two significant episodes in April, each lasting a couple of days, during which she also experienced shortness of breath and was treated with an inhaler and breathing treatments.  She uses Benadryl  and Zyrtec  to manage her symptoms but is not taking any medications daily. She continues to experience itching every other day, though no new rashes have appeared. She has a known allergy to yogurt, which causes itching and rash, and she avoids it. There have been no recent changes in medications, diet, or personal care products. She has not undergone any allergy testing in the past.  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 She denies having pets at home or at work. In the review of symptoms, she reports frequent sneezing, especially in the current month, but denies runny or stuffy nose, itchy or watery eyes, coughing, or wheezing. She has not experienced any recent fevers or chills and reports normal appetite and bowel movements.     Rash started about in  March 2025. Mainly occurs on her hands, foot. Describes them as itchy, raised. Associated symptoms include: shortness of breath.  Frequency of episodes: 2 episodes lasting a few a days. Suspected triggers are unknown. She had some respiratory type of symptoms. Denies any changes in medications, foods, personal care products. She has tried the following therapies: benadryl , zyrtec  and prednisone  with good benefit. Systemic steroids: yes. Currently on no daily antihistamines.  Previous work up includes: none. Previous history of rash/hives: yes. Patient is up to date with the following cancer screening tests: physical exam, mammogram, pap smears.  Assessment and Plan: [name] is a 43 y.o. female with: Rash and other nonspecific skin eruption Other adverse food reactions, not elsewhere classified, subsequent encounter Intermittent rash and itching, primarily on hands and feet. 2 major episodes requiring urgent care and systemic steroids. No triggers noted. Concerned about environmental and food allergy trigger as yogurt caused some itching and grass caused contact rash. Associated symptoms shortness of breath.  Etiology unclear.  Write down what you had done/eaten during flares.  Start zyrtec  (cetirizine ) 10mg  once a day and may take twice a day if needed.  If symptoms are not controlled or causes drowsiness let us  know. Avoid the following potential triggers: alcohol, tight clothing, NSAIDs, hot showers and getting overheated. See below for proper skin care.  Get bloodwork.  Other allergic rhinitis Some rhinitis symptoms. No prior allergy testing. Get bloodwork as patient has tattooes on her back and arms.  Moderate persistent reactive airway disease without complication Tobacco user Patient required albuterol and nebulizer treatment  with above episode. Smokes 1ppd.  Today's spirometry was normal with 270cc and 13% improvement in FEV1 post bronchodilator treatment. Clinically feeling  unchanged.  Patient may be an underperceiver of her symptoms.  Daily controller medication(s): start Breo 100mcg 1 puff once a day and rinse mouth after each use.  May use albuterol rescue inhaler 2 puffs every 4 to 6 hours as needed for shortness of breath, chest tightness, coughing, and wheezing. Monitor frequency of use - if you need to use it more than twice per week on a consistent basis let us  know.  Get spirometry at next visit.  Return in about 2 months (around 07/17/2024).  Meds ordered this encounter  Medications   cetirizine  (ZYRTEC  ALLERGY) 10 MG tablet    Sig: Take 1 tablet (10 mg total) by mouth daily.    Dispense:  30 tablet    Refill:  3   fluticasone  furoate-vilanterol (BREO ELLIPTA ) 100-25 MCG/ACT AEPB    Sig: Inhale 1 puff into the lungs daily. Rinse mouth after each use.    Dispense:  60 each    Refill:  1   Lab Orders         Allergens w/Total IgE Area 2         IgE Food Basic w/Component Rfx         Alpha-Gal Panel         Chronic Urticaria (CU) Eval         Comprehensive metabolic panel with GFR         Tryptase         ANA, IFA (with reflex)      Other allergy screening: Asthma: no recent albuterol use but it helped with her shortness of breath. Rhino conjunctivitis: yes Some sneezing recently and rash with grass. No prior allergy testing.  Food allergy: no Avoiding yogurt as it makes her itchy.  Medication allergy: yes Hymenoptera allergy: no Eczema:no History of recurrent infections suggestive of immunodeficency: no  Diagnostics: Spirometry:  Tracings reviewed. Her effort: It was hard to get consistent efforts and there is a question as to whether this reflects a maximal maneuver. FVC: 2.39L FEV1: 2.05L, 82% predicted FEV1/FVC ratio: 86% Interpretation: Spirometry consistent with normal pattern with 270cc and 13% improvement in FEV1 post bronchodilator treatment. Clinically feeling unchanged.   Please see scanned spirometry results for  details.  Results discussed with patient/family.   Past Medical History: Patient Active Problem List   Diagnosis Date Noted   Endometrial mass 06/17/2023   HGSIL (high grade squamous intraepithelial lesion) on Pap smear of cervix 06/17/2023   Past Medical History:  Diagnosis Date   Anxiety    Bipolar disorder (HCC)    Follows w/ Mabel Pleas, PA.   Urticaria    Wears glasses    Past Surgical History: Past Surgical History:  Procedure Laterality Date   DILATATION & CURETTAGE/HYSTEROSCOPY WITH MYOSURE N/A 06/17/2023   Procedure: DILATATION & CURETTAGE/HYSTEROSCOPY WITH MYOSURE;  Surgeon: Rosalva Sawyer, MD;  Location: [facility];  Service: Gynecology;  Laterality: N/A;   LEEP  06/17/2023   Procedure: LOOP ELECTROSURGICAL EXCISION PROCEDURE (LEEP);  Surgeon: Rosalva Sawyer, MD;  Location: [facility];  Service: Gynecology;;   SALPINGECTOMY     WISDOM TOOTH EXTRACTION     x 4   Medication List:  Current Outpatient Medications  Medication Sig Dispense Refill   cetirizine  (ZYRTEC  ALLERGY) 10 MG tablet Take 1 tablet (10 mg total) by mouth daily. 30 tablet 3  fluticasone  furoate-vilanterol (BREO ELLIPTA ) 100-25 MCG/ACT AEPB Inhale 1 puff into the lungs daily. Rinse mouth after each use. 60 each 1   No current facility-administered medications for this visit.   Allergies: Allergies  Allergen Reactions   Rocephin [Ceftriaxone Sodium In Dextrose] Other (See Comments)    Skin burns and feels like it on fire.   Ca Phosphate-Cholecalciferol     Patient unsure of reaction.   [facility]vs Yogurt + Calcium [Ca Phosphate-Cholecalciferol]     Patient unsure of reaction.   [facility]vs Mineral Salts [Bath Products] Rash   Social History: Social History   Socioeconomic History   Marital status: Single    Spouse name: Not on file   Number of children: Not on file   Years of education: Not on file   Highest education level: Not on file  Occupational History   Not on file   Tobacco Use   Smoking status: Every Day    Current packs/day: 1.00    Average packs/day: 1 pack/day for 26.6 years (26.6 ttl pk-yrs)    Types: Cigarettes    Start date: 1999   Smokeless tobacco: Never  Vaping Use   Vaping status: Never Used  Substance and Sexual Activity   Alcohol use: Yes    Comment: socially   Drug use: Yes    Types: Marijuana    Comment: a couple of times per day   Sexual activity: Yes    Birth control/protection: None  Other Topics Concern   Not on file  Social History Narrative   Not on file   Social Drivers of Health   Financial Resource Strain: Not on file  Food Insecurity: No Food Insecurity (01/17/2021)   Received from [facility]   Hunger Vital Sign    Worried About Running Out of Food in the Last Year: Never true    Ran Out of Food in the Last Year: Never true  Transportation Needs: Not on file  Physical Activity: Not on file  Stress: Not on file  Social Connections: Not on file   Lives in a townhouse. Smoking: 1 ppd x 15 years Occupation: caretaker  Environmental HistorySurveyor, minerals in the house: no Carpet in the family room: no Carpet in the bedroom: no Heating: electric Cooling: central Pet: no  Family History: Family History  Problem Relation Age of Onset   Asthma Mother    Sarcoidosis Mother    Seizures Father    Asthma Sister    Review of Systems  Constitutional:  Negative for appetite change, chills, fever and unexpected weight change.  HENT:  Negative for congestion and rhinorrhea.   Eyes:  Negative for itching.  Respiratory:  Negative for cough, chest tightness, shortness of breath and wheezing.   Cardiovascular:  Negative for chest pain.  Gastrointestinal:  Negative for abdominal pain.  Genitourinary:  Negative for difficulty urinating.  Skin:  Positive for rash.       pruritus  Neurological:  Negative for headaches.    Objective: BP (!) 132/92 (BP Location: Left Arm, Patient Position: Sitting)    Pulse (!) 116   Temp 98.8 F (37.1 C) (Temporal)   Resp 18   Ht 5' 3.19 (1.605 m)   Wt 216 lb 12.8 oz (98.3 kg)   SpO2 97%   BMI 38.18 kg/m  Body mass index is 38.18 kg/m. Physical Exam Vitals and nursing note reviewed.  Constitutional:      Appearance: Normal appearance. She is well-developed.  HENT:  Head: Normocephalic and atraumatic.     Right Ear: Tympanic membrane and external ear normal.     Left Ear: Tympanic membrane and external ear normal.     Nose: Nose normal.     Mouth/Throat:     Mouth: Mucous membranes are moist.     Pharynx: Oropharynx is clear.  Eyes:     Conjunctiva/sclera: Conjunctivae normal.  Cardiovascular:     Rate and Rhythm: Normal rate and regular rhythm.     Heart sounds: Normal heart sounds. No murmur heard.    No friction rub. No gallop.  Pulmonary:     Effort: Pulmonary effort is normal.     Breath sounds: Normal breath sounds. No wheezing, rhonchi or rales.  Musculoskeletal:     Cervical back: Neck supple.  Skin:    General: Skin is warm.     Findings: No rash.  Neurological:     Mental Status: She is alert and oriented to person, place, and time.  Psychiatric:        Behavior: Behavior normal.    The plan was reviewed with the patient/family, and all questions/concerned were addressed.  It was my pleasure to see Brielynn today and participate in her care. Please feel free to contact me with any questions or concerns.  Sincerely,  Orlan Cramp, DO Allergy & Immunology  [facility]  [facility] office: [phone] [facility] office: [phone]

## 2024-05-16 NOTE — Patient Instructions (Addendum)
 Skin  I'm not sure what's causing your itching/rashes.  Keep track of rashes and take pictures. Write down what you had done/eaten during flares.  Start zyrtec  (cetirizine ) 10mg  once a day and may take twice a day if needed.  If symptoms are not controlled or causes drowsiness let us  know. Avoid the following potential triggers: alcohol, tight clothing, NSAIDs, hot showers and getting overheated. See below for proper skin care.  Get bloodwork. We are ordering labs, so please allow 1-2 weeks for the results to come back. With the newly implemented Cures Act, the labs might be visible to you at the same time that they become visible to me. However, I will not address the results until all of the results are back, so please be patient.   Environmental allergies Get bloodwork.   Breathing Breathing test concerning for asthma. Daily controller medication(s): start Breo 100mcg 1 puff once a day and rinse mouth after each use.  May use albuterol rescue inhaler 2 puffs every 4 to 6 hours as needed for shortness of breath, chest tightness, coughing, and wheezing. Monitor frequency of use - if you need to use it more than twice per week on a consistent basis let us  know.  Breathing control goals:  Full participation in all desired activities (may need albuterol before activity) Albuterol use two times or less a week on average (not counting use with activity) Cough interfering with sleep two times or less a month Oral steroids no more than once a year No hospitalizations   Follow up in 2 months or sooner if needed.   Skin care recommendations  Bath time: Always use lukewarm water. AVOID very hot or cold water. Keep bathing time to 5-10 minutes. Do NOT use bubble bath. Use a mild soap and use just enough to wash the dirty areas. Do NOT scrub skin vigorously.  After bathing, pat dry your skin with a towel. Do NOT rub or scrub the skin.  Moisturizers and prescriptions:  ALWAYS apply  moisturizers immediately after bathing (within 3 minutes). This helps to lock-in moisture. Use the moisturizer several times a day over the whole body. Good summer moisturizers include: Aveeno, CeraVe, Cetaphil. Good winter moisturizers include: Aquaphor, Vaseline, Cerave, Cetaphil, Eucerin, Vanicream. When using moisturizers along with medications, the moisturizer should be applied about one hour after applying the medication to prevent diluting effect of the medication or moisturize around where you applied the medications. When not using medications, the moisturizer can be continued twice daily as maintenance.  Laundry and clothing: Avoid laundry products with added color or perfumes. Use unscented hypo-allergenic laundry products such as Tide free, Cheer free & gentle, and All free and clear.  If the skin still seems dry or sensitive, you can try double-rinsing the clothes. Avoid tight or scratchy clothing such as wool. Do not use fabric softeners or dyer sheets.

## 2024-09-07 ENCOUNTER — Telehealth: Payer: Self-pay

## 2024-09-07 ENCOUNTER — Other Ambulatory Visit (HOSPITAL_COMMUNITY): Payer: Self-pay

## 2024-09-07 ENCOUNTER — Other Ambulatory Visit: Payer: Self-pay

## 2024-09-07 ENCOUNTER — Ambulatory Visit (INDEPENDENT_AMBULATORY_CARE_PROVIDER_SITE_OTHER): Admitting: Internal Medicine

## 2024-09-07 ENCOUNTER — Encounter: Payer: Self-pay | Admitting: Internal Medicine

## 2024-09-07 ENCOUNTER — Other Ambulatory Visit (HOSPITAL_COMMUNITY)
Admission: RE | Admit: 2024-09-07 | Discharge: 2024-09-07 | Disposition: A | Discharge status: Home / Self Care | Source: Ambulatory Visit | Attending: Internal Medicine | Admitting: Internal Medicine

## 2024-09-07 VITALS — BP 124/84 | HR 82 | Temp 98.7°F | Resp 16 | Wt 222.2 lb

## 2024-09-07 DIAGNOSIS — Z1159 Encounter for screening for other viral diseases: Secondary | ICD-10-CM | POA: Diagnosis not present

## 2024-09-07 DIAGNOSIS — Z7251 High risk heterosexual behavior: Secondary | ICD-10-CM

## 2024-09-07 DIAGNOSIS — Z113 Encounter for screening for infections with a predominantly sexual mode of transmission: Secondary | ICD-10-CM

## 2024-09-07 NOTE — Telephone Encounter (Signed)
 Patient was seen by Dr. Overton today in the clinic and patient is interested in lenacapavir Pearlie) for PrEP. Counseled patient that they will need to complete an oral loading dose with initial injections. Counseled that patient will take two lenacapavir 300 mg tablets (600 mg total) orally on the first day of their injection and will take two lenacapavir 300 mg tablets (600 mg total) orally the next day regardless of meals. Counseled patient that lenacapavir is two separate subcutaneous injections in the abdomen every 6 months. Reviewed that the main side effects are injection-site soreness and nodules. Discussed measures for relief including cold packs/heat packs and over-the-counter pain relievers (such as acetaminophen  or ibuprofen ). Patient currently scheduled for 10/05/2024 with Cassie and patient will reach out with any questions or concerns.   Thank you,  Feliciano Close, PharmD PGY2 Infectious Diseases Pharmacy Resident

## 2024-09-07 NOTE — Patient Instructions (Addendum)
 Hiv/hepatitis/urine testing today  Please follow up with pcp if symptomatic concern for BV (not std)   Will refer you to our pharmacy team to determine qualification for hiv prophylaxis injectables

## 2024-09-07 NOTE — Progress Notes (Signed)
 [facility]  Patient Active Problem List   Diagnosis Date Noted   Endometrial mass 06/17/2023   HGSIL (high grade squamous intraepithelial lesion) on Pap smear of cervix 06/17/2023      Subjective:    Patient ID: Christina Holden, female    DOB: 1981-06-28, 43 y.o.   MRN: [id number]  Chief Complaint  Patient presents with   SEXUALLY TRANSMITTED DISEASE    HPI:  Christina Holden is a 43 y.o. female here for routine std testing  She has no symptoms and no known exposure to any std she knows of  She never heard about HIV PrEp and is interested    Allergies  Allergen Reactions   Rocephin [Ceftriaxone Sodium In Dextrose] Other (See Comments)    Skin burns and feels like it on fire.   Ca Phosphate-Cholecalciferol     Patient unsure of reaction.   Cvs Yogurt + Calcium [Ca Phosphate-Cholecalciferol]     Patient unsure of reaction.   [facility]vs Mineral Salts [Bath Products] Rash      Outpatient Medications Prior to Visit  Medication Sig Dispense Refill   cetirizine  (ZYRTEC  ALLERGY) 10 MG tablet Take 1 tablet (10 mg total) by mouth daily. 30 tablet 3   fluticasone  furoate-vilanterol (BREO ELLIPTA ) 100-25 MCG/ACT AEPB Inhale 1 puff into the lungs daily. Rinse mouth after each use. 60 each 1   No facility-administered medications prior to visit.     Social History   Socioeconomic History   Marital status: Single    Spouse name: Not on file   Number of children: Not on file   Years of education: Not on file   Highest education level: Not on file  Occupational History   Not on file  Tobacco Use   Smoking status: Every Day    Current packs/day: 1.00    Average packs/day: 1 pack/day for 26.9 years (26.9 ttl pk-yrs)    Types: Cigarettes    Start date: 1999   Smokeless tobacco: Never  Vaping Use   Vaping status: Never Used  Substance and Sexual Activity   Alcohol use: Yes    Comment: socially   Drug use: Yes    Types: Marijuana     Comment: a couple of times per day   Sexual activity: Yes    Birth control/protection: None  Other Topics Concern   Not on file  Social History Narrative   Not on file   Social Drivers of Health   Financial Resource Strain: Not on file  Food Insecurity: No Food Insecurity (01/17/2021)   Received from [facility]   Hunger Vital Sign    Worried About Running Out of Food in the Last Year: Never true    Ran Out of Food in the Last Year: Never true  Transportation Needs: Not on file  Physical Activity: Not on file  Stress: Not on file  Social Connections: Not on file  Intimate Partner Violence: Not on file      Review of Systems    All other ros negative  Objective:    BP 124/84   Pulse 82   Temp 98.7 F (37.1 C) (Temporal)   Resp 16   Wt 222 lb 3.2 oz (100.8 kg)   SpO2 99%   BMI 39.13 kg/m  Nursing note and vital signs reviewed.  Physical Exam     General/constitutional: no distress, pleasant HEENT: Normocephalic, PER, Conj Clear, EOMI, Oropharynx clear Neck supple CV: rrr  no mrg Lungs: clear to auscultation, normal respiratory effort Abd: Soft, Nontender Ext: no edema Skin: No Rash Neuro: nonfocal MSK: no peripheral joint swelling/tenderness/warmth; back spines nontender   Labs: Lab Results  Component Value Date   WBC 10.0 06/17/2023   HGB 11.4 (L) 06/17/2023   HCT 39.8 06/17/2023   MCV 82.6 06/17/2023   PLT 258 06/17/2023   Last metabolic panel Lab Results  Component Value Date   GLUCOSE 87 06/17/2023   NA 136 06/17/2023   K 4.3 06/17/2023   CL 108 06/17/2023   CO2 22 06/17/2023   BUN 12 06/17/2023   CREATININE 0.87 06/17/2023   GFRNONAA >60 06/17/2023   CALCIUM 8.1 (L) 06/17/2023   ANIONGAP 6 06/17/2023   No results found for: HIV1RNAQUANT No results found for: CD4TCELL, CD4TABS  Micro:  Serology:  Imaging:  Assessment & Plan:   Problem List Items Addressed This Visit   None Visit Diagnoses       Screen for STD  (sexually transmitted disease)    -  Primary   Relevant Orders   HIV 1 RNA quant-no reflex-bld   HIV antibody (with reflex)   Urine cytology ancillary only   Hepatitis B Surface AntiGEN   Hepatitis B Core Antibody, total   Hepatitis B surface antibody,quantitative   Hepatitis C Antibody   RPR     Need for hepatitis B screening test       Relevant Orders   Hepatitis B Surface AntiGEN   Hepatitis B Core Antibody, total   Hepatitis B surface antibody,quantitative     Need for hepatitis C screening test       Relevant Orders   Hepatitis C Antibody     High risk heterosexual behavior             No orders of the defined types were placed in this encounter.    Discuss hiv Prep. Patient interested and she would like lenacapavir if possible  Will refer to pharmacy team to screen for [facility] qualification  Hiv/std/hepatitis testing today  F/u every 3 months for hiv prep  Doxycycline  discussed not as effective in this population     Follow-up: No follow-ups on file.      Christina ONEIDA Passer, MD [facility] 09/07/2024, 8:39 AM

## 2024-09-08 LAB — URINE CYTOLOGY ANCILLARY ONLY
Chlamydia: NEGATIVE
Comment: NEGATIVE
Comment: NEGATIVE
Comment: NORMAL
Neisseria Gonorrhea: NEGATIVE
Trichomonas: NEGATIVE

## 2024-09-09 LAB — HIV-1 RNA QUANT-NO REFLEX-BLD
HIV 1 RNA Quant: NOT DETECTED {copies}/mL
HIV-1 RNA Quant, Log: NOT DETECTED {Log_copies}/mL

## 2024-09-09 LAB — HEPATITIS B SURFACE ANTIGEN: Hepatitis B Surface Ag: NONREACTIVE

## 2024-09-09 LAB — HEPATITIS C ANTIBODY: Hepatitis C Ab: NONREACTIVE

## 2024-09-09 LAB — HIV ANTIBODY (ROUTINE TESTING W REFLEX)
HIV 1&2 Ab, 4th Generation: NONREACTIVE
HIV FINAL INTERPRETATION: NEGATIVE

## 2024-09-09 LAB — HEPATITIS B CORE ANTIBODY, TOTAL: Hep B Core Total Ab: NONREACTIVE

## 2024-09-09 LAB — RPR: RPR Ser Ql: NONREACTIVE

## 2024-09-09 LAB — HEPATITIS B SURFACE ANTIBODY, QUANTITATIVE: Hep B S AB Quant (Post): >1000 m[IU]/mL (ref >=10)

## 2024-09-21 ENCOUNTER — Other Ambulatory Visit: Payer: Self-pay

## 2024-09-21 ENCOUNTER — Other Ambulatory Visit: Payer: Self-pay | Admitting: Pharmacist

## 2024-09-21 ENCOUNTER — Telehealth: Payer: Self-pay

## 2024-09-21 ENCOUNTER — Other Ambulatory Visit (HOSPITAL_COMMUNITY): Payer: Self-pay

## 2024-09-21 DIAGNOSIS — Z79899 Other long term (current) drug therapy: Secondary | ICD-10-CM

## 2024-09-21 MED ORDER — YEZTUGO 463.5 MG/1.5ML ~~LOC~~ SOLN
927.0000 mg | SUBCUTANEOUS | 1 refills | Status: AC
  Filled 2024-09-21 – 2024-09-27 (×2): qty 3, 34d supply, fill #0

## 2024-09-21 MED ORDER — LENACAPAVIR SODIUM 300 MG PO TABS
600.0000 mg | ORAL_TABLET | Freq: Every day | ORAL | 0 refills | Status: AC
  Filled 2024-09-21: qty 4, 2d supply, fill #0

## 2024-09-21 NOTE — Progress Notes (Signed)
 Specialty Pharmacy Initial Fill Coordination Note  Christina Holden is a 43 y.o. female contacted today regarding initial fill of specialty medication(s) Lenacapavir Sodium  PEARLIE)   Patient requested Courier to Provider Office   Delivery date: 09/27/24   Verified address: [address] [place] [address]   Medication will be filled on 09/26/24.   Patient is aware of 0.00 copayment.

## 2024-09-21 NOTE — Telephone Encounter (Signed)
 Pharmacy Patient Advocate Encounter  Insurance verification completed.   The patient is insured through [employer]   Ran test claim for [facility]. Currently a quantity of 4 Tablets is a 15 day supply We will need a script sent to [facility] because there are code we will need to put in but copay  .   This test claim was processed through [facility]- copay amounts may vary at other pharmacies due to pharmacy/plan contracts, or as the patient moves through the different stages of their insurance plan.

## 2024-09-21 NOTE — Telephone Encounter (Signed)
 Sent tablets and injection to [facility]. Thanks Arland!

## 2024-09-23 ENCOUNTER — Other Ambulatory Visit: Payer: Self-pay

## 2024-09-26 ENCOUNTER — Other Ambulatory Visit: Payer: Self-pay

## 2024-09-27 ENCOUNTER — Other Ambulatory Visit: Payer: Self-pay

## 2024-09-27 ENCOUNTER — Other Ambulatory Visit (HOSPITAL_COMMUNITY): Payer: Self-pay

## 2024-09-27 ENCOUNTER — Telehealth: Payer: Self-pay | Admitting: Pharmacist

## 2024-09-27 NOTE — Progress Notes (Signed)
 Patient changed her mind in using Yeztugo for Prep

## 2024-09-27 NOTE — Telephone Encounter (Signed)
 Thank you :)

## 2024-09-27 NOTE — Telephone Encounter (Signed)
 Patient discussed Christina Holden with Feliciano; he noted she was hesitant to start the injection. Spoke with patient over the phone today who states she would like to decline PrEP as a whole at this time. Cancelled appointment with Cassie next week and encouraged her to reach out to us  if she ever changes her mind.  Alan Geralds, PharmD, CPP, BCIDP, AAHIVP Clinical Pharmacist Practitioner Infectious Diseases Clinical Pharmacist [facility]

## 2024-10-05 ENCOUNTER — Ambulatory Visit: Admitting: Pharmacist

## 2024-10-24 ENCOUNTER — Other Ambulatory Visit: Payer: Self-pay | Admitting: Physician Assistant

## 2024-10-24 ENCOUNTER — Ambulatory Visit
Admission: RE | Admit: 2024-10-24 | Discharge: 2024-10-24 | Disposition: A | Source: Ambulatory Visit | Attending: Physician Assistant | Admitting: Physician Assistant

## 2024-10-24 DIAGNOSIS — N6489 Other specified disorders of breast: Secondary | ICD-10-CM

## 2024-10-24 DIAGNOSIS — R928 Other abnormal and inconclusive findings on diagnostic imaging of breast: Secondary | ICD-10-CM

## 2024-10-26 ENCOUNTER — Other Ambulatory Visit: Payer: Self-pay | Admitting: Physician Assistant

## 2024-10-26 DIAGNOSIS — R928 Other abnormal and inconclusive findings on diagnostic imaging of breast: Secondary | ICD-10-CM

## 2024-11-24 ENCOUNTER — Encounter (HOSPITAL_COMMUNITY): Payer: Self-pay | Admitting: *Deleted

## 2024-11-24 ENCOUNTER — Ambulatory Visit (HOSPITAL_COMMUNITY)
Admission: EM | Admit: 2024-11-24 | Discharge: 2024-11-24 | Disposition: A | Discharge status: Home / Self Care | Attending: Family Medicine | Admitting: Family Medicine

## 2024-11-24 ENCOUNTER — Other Ambulatory Visit: Payer: Self-pay

## 2024-11-24 DIAGNOSIS — J111 Influenza due to unidentified influenza virus with other respiratory manifestations: Secondary | ICD-10-CM

## 2024-11-24 DIAGNOSIS — J4531 Mild persistent asthma with (acute) exacerbation: Secondary | ICD-10-CM

## 2024-11-24 LAB — POCT INFLUENZA A/B
Influenza A, POC: NEGATIVE
Influenza B, POC: NEGATIVE

## 2024-11-24 LAB — POC SOFIA SARS ANTIGEN FIA: SARS Coronavirus 2 Ag: NEGATIVE

## 2024-11-24 MED ORDER — IBUPROFEN 800 MG PO TABS
800.0000 mg | ORAL_TABLET | Freq: Once | ORAL | Status: AC
  Administered 2024-11-24: 800 mg via ORAL

## 2024-11-24 MED ORDER — ONDANSETRON 4 MG PO TBDP: 4.0000 mg | ORAL_TABLET | Freq: Three times a day (TID) | ORAL | 0 refills | Status: AC | PRN

## 2024-11-24 MED ORDER — ONDANSETRON 4 MG PO TBDP
ORAL_TABLET | ORAL | Status: AC
  Filled 2024-11-24: qty 1

## 2024-11-24 MED ORDER — ONDANSETRON 4 MG PO TBDP
4.0000 mg | ORAL_TABLET | Freq: Once | ORAL | Status: AC
  Administered 2024-11-24: 4 mg via ORAL

## 2024-11-24 MED ORDER — ALBUTEROL SULFATE (2.5 MG/3ML) 0.083% IN NEBU
INHALATION_SOLUTION | RESPIRATORY_TRACT | Status: AC
  Filled 2024-11-24: qty 3

## 2024-11-24 MED ORDER — ACETAMINOPHEN 325 MG PO TABS
ORAL_TABLET | ORAL | Status: AC
  Filled 2024-11-24: qty 3

## 2024-11-24 MED ORDER — IBUPROFEN 800 MG PO TABS
ORAL_TABLET | ORAL | Status: AC
  Filled 2024-11-24: qty 1

## 2024-11-24 MED ORDER — ALBUTEROL SULFATE HFA 108 (90 BASE) MCG/ACT IN AERS: 2.0000 | INHALATION_SPRAY | Freq: Four times a day (QID) | RESPIRATORY_TRACT | 2 refills | Status: AC | PRN

## 2024-11-24 MED ORDER — PREDNISONE 20 MG PO TABS: 40.0000 mg | ORAL_TABLET | Freq: Every day | ORAL | 0 refills | Status: AC

## 2024-11-24 MED ORDER — ALBUTEROL SULFATE (2.5 MG/3ML) 0.083% IN NEBU
2.5000 mg | INHALATION_SOLUTION | Freq: Once | RESPIRATORY_TRACT | Status: AC
  Administered 2024-11-24: 2.5 mg via RESPIRATORY_TRACT

## 2024-11-24 MED ORDER — ACETAMINOPHEN 325 MG PO TABS
975.0000 mg | ORAL_TABLET | Freq: Once | ORAL | Status: AC
  Administered 2024-11-24: 975 mg via ORAL

## 2024-11-24 NOTE — ED Provider Notes (Cosign Needed)
 " [facility]    CSN: [id number] Arrival date & time: 11/24/24  0800      History   Chief Complaint Chief Complaint  Patient presents with   Fever   Cough   Generalized Body Aches    HPI Christina Holden is a 44 y.o. female.   The patient presents with generalized body aches, headache, and cough.  Constitutional symptoms - Diffuse body aches since Monday after caring for boyfriend with similar symptoms - Severe headache onset Monday - Heavy sweating - Poor oral intake due to nausea, able to drink fluids - Sleeping much of the day, waking to medicate  Respiratory symptoms - Nonproductive cough - Chest pain with coughing - Sore throat developed yesterday - Dry mouth, using ice chips for relief - Recently ran out of inhaler used for allergies  Gastrointestinal symptoms - Persistent vomiting since Tuesday, including vomiting after eating - Bowel incontinence since Tuesday - No abdominal pain  Medication use - Ibuprofen  600 mg every six hours, last dose at midnight, without relief - Nyquil use, without relief  Social history relevant to present illness - No tobacco use for two weeks  The history is provided by the patient. No language interpreter was used.    Past Medical History:  Diagnosis Date   Anxiety    Bipolar disorder (HCC)    Follows w/ Mabel Pleas, PA.   Urticaria    Wears glasses     Patient Active Problem List   Diagnosis Date Noted   Endometrial mass 06/17/2023   HGSIL (high grade squamous intraepithelial lesion) on Pap smear of cervix 06/17/2023    Past Surgical History:  Procedure Laterality Date   DILATATION & CURETTAGE/HYSTEROSCOPY WITH MYOSURE N/A 06/17/2023   Procedure: DILATATION & CURETTAGE/HYSTEROSCOPY WITH MYOSURE;  Surgeon: Rosalva Sawyer, MD;  Location: [facility];  Service: Gynecology;  Laterality: N/A;   LEEP  06/17/2023   Procedure: LOOP ELECTROSURGICAL EXCISION PROCEDURE (LEEP);  Surgeon: Rosalva Sawyer,  MD;  Location: [facility];  Service: Gynecology;;   SALPINGECTOMY     WISDOM TOOTH EXTRACTION     x 4    OB History   No obstetric history on file.      Home Medications    Prior to Admission medications  Medication Sig Start Date End Date Taking? Authorizing Provider  albuterol  (VENTOLIN  HFA) 108 (90 Base) MCG/ACT inhaler Inhale 2 puffs into the lungs every 6 (six) hours as needed for wheezing or shortness of breath. 11/24/24  Yes Alba Sharper, MD  ondansetron  (ZOFRAN -ODT) 4 MG disintegrating tablet Take 1 tablet (4 mg total) by mouth every 8 (eight) hours as needed for nausea or vomiting. 11/24/24  Yes Alba Sharper, MD  predniSONE  (DELTASONE ) 20 MG tablet Take 2 tablets (40 mg total) by mouth daily with breakfast for 5 days. 11/24/24 11/29/24 Yes Alba Sharper, MD  cetirizine  (ZYRTEC  ALLERGY) 10 MG tablet Take 1 tablet (10 mg total) by mouth daily. 05/16/24   Luke Orlan HERO, DO  fluticasone  furoate-vilanterol (BREO ELLIPTA ) 100-25 MCG/ACT AEPB Inhale 1 puff into the lungs daily. Rinse mouth after each use. 05/16/24   Luke Orlan HERO, DO  SQ injection lenacapavir  (YEZTUGO ) 463.5 MG/1.5ML SQ injection Inject 3 mLs (927 mg total) into the skin every 6 (six) months. Administer each injection subcutaneously at separate sites in the abdomen (more or equal to 2 inches from the navel). 09/21/24   Kuppelweiser, Charlott CROME, RPH-CPP    Family History Family History  Problem Relation  Age of Onset   Asthma Mother    Sarcoidosis Mother    Seizures Father    Asthma Sister     Social History Social History[1]   Allergies   Rocephin [ceftriaxone sodium in dextrose], Ca phosphate-cholecalciferol, Cvs yogurt + calcium [ca phosphate-cholecalciferol], and Cvs mineral salts [bath products]   Review of Systems Review of Systems   Physical Exam Triage Vital Signs ED Triage Vitals  Encounter Vitals Group     BP 11/24/24 0827 (!) 135/92     Girls Systolic BP Percentile --       Girls Diastolic BP Percentile --      Boys Systolic BP Percentile --      Boys Diastolic BP Percentile --      Pulse Rate 11/24/24 0827 (!) 119     Resp 11/24/24 0827 20     Temp 11/24/24 0827 99.8 F (37.7 C)     Temp src --      SpO2 11/24/24 0827 95 %     Weight --      Height --      Head Circumference --      Peak Flow --      Pain Score 11/24/24 0823 10     Pain Loc --      Pain Education --      Exclude from Growth Chart --    No data found.  Updated Vital Signs BP (!) 135/92   Pulse (!) 119   Temp 99.8 F (37.7 C)   Resp 20   LMP 11/08/2024   SpO2 95%   Visual Acuity Right Eye Distance:   Left Eye Distance:   Bilateral Distance:    Right Eye Near:   Left Eye Near:    Bilateral Near:     Physical Exam Vitals and nursing note reviewed.  Constitutional:      General: She is not in acute distress.    Appearance: She is well-developed.  HENT:     Head: Normocephalic and atraumatic.     Right Ear: External ear normal.     Left Ear: External ear normal.  Eyes:     General:        Right eye: No discharge.        Left eye: No discharge.     Conjunctiva/sclera: Conjunctivae normal.  Cardiovascular:     Rate and Rhythm: Regular rhythm. Tachycardia present.     Heart sounds: No murmur heard. Pulmonary:     Effort: No respiratory distress.     Breath sounds: Normal breath sounds. No wheezing.     Comments: Mildly tachypnic Abdominal:     Palpations: Abdomen is soft.     Tenderness: There is no abdominal tenderness.  Musculoskeletal:        General: No swelling.     Cervical back: Neck supple.  Skin:    General: Skin is warm and dry.     Capillary Refill: Capillary refill takes less than 2 seconds.  Neurological:     Mental Status: She is alert.  Psychiatric:        Mood and Affect: Mood normal.      UC Treatments / Results  Labs (all labs ordered are listed, but only abnormal results are displayed) Labs Reviewed  POCT INFLUENZA A/B  POC  SOFIA SARS ANTIGEN FIA    EKG   Radiology No results found.  Procedures Procedures (including critical care time)  Medications Ordered in UC Medications  acetaminophen  (TYLENOL ) tablet  975 mg (975 mg Oral Given 11/24/24 0849)  ibuprofen  (ADVIL ) tablet 800 mg (800 mg Oral Given 11/24/24 0849)  ondansetron  (ZOFRAN -ODT) disintegrating tablet 4 mg (4 mg Oral Given 11/24/24 0849)  albuterol  (PROVENTIL ) (2.5 MG/3ML) 0.083% nebulizer solution 2.5 mg (2.5 mg Nebulization Given 11/24/24 0903)    Initial Impression / Assessment and Plan / UC Course  I have reviewed the triage vital signs and the nursing notes.  Pertinent labs & imaging results that were available during my care of the patient were reviewed by me and considered in my medical decision making (see chart for details).  Clinical Course as of 11/24/24 0939  Thu Nov 24, 2024  0854 Flu and Covid Negative [MQ]  [phone] Recheck after nebulizer and antipyretics. Patient appears more comfortable. CTAB. Continues to be mildly tachycardic. Less tachypnic.  [MQ]    Clinical Course User Index [MQ] Alba Sharper, MD    Clinically consistent with influenza-like illness.  COVID-negative.  No red flags suggest bacterial infection including pneumonia, or strep. Patient is notably tachycardic, but regular, consistent with mild dehydration and acute illness. Reviewed patient's PFTs from July 2025.  Does have response to bronchodilators consistent with some level of reactive airway disease.  Due to this will additionally treat for reactive airway exacerbation with 5-day course of prednisone  burst.  Refilled patient's albuterol  inhaler as well.  Counseled regarding supportive care and over-the-counter medicine use listed in AVS.   Final Clinical Impressions(s) / UC Diagnoses   Final diagnoses:  Influenza-like illness  Mild persistent reactive airway disease with acute exacerbation     Discharge Instructions      - You have a viral illness  causing your symptoms. - I believe this is causing a exacerbation of your reactive airway disease. - I have sent a short course of steroids to help with this to your pharmacy. - I have also refilled your albuterol  inhaler. - This will get better in the next several days. - You may use Tylenol  and Ibuprofen  as needed for pain. - Over the counter allergy medicine such as Claritin and Flonase may help with your congestion. - Cough drops may help with your throat and cough. - If you do not get better in the next 5 days please return to care. - It is important to stay hydrated, and continue eating while sick. - I have also sent you a medicine for nausea called Zofran .      ED Prescriptions     Medication Sig Dispense Auth. Provider   albuterol  (VENTOLIN  HFA) 108 (90 Base) MCG/ACT inhaler Inhale 2 puffs into the lungs every 6 (six) hours as needed for wheezing or shortness of breath. 8 g Fredrica Capano, MD   predniSONE  (DELTASONE ) 20 MG tablet Take 2 tablets (40 mg total) by mouth daily with breakfast for 5 days. 10 tablet Marlaina Coburn, MD   ondansetron  (ZOFRAN -ODT) 4 MG disintegrating tablet Take 1 tablet (4 mg total) by mouth every 8 (eight) hours as needed for nausea or vomiting. 20 tablet Alyssamae Klinck, MD      PDMP not reviewed this encounter.    [1]  Social History Tobacco Use   Smoking status: Every Day    Current packs/day: 1.00    Average packs/day: 1 pack/day for 27.1 years (27.1 ttl pk-yrs)    Types: Cigarettes    Start date: 1999   Smokeless tobacco: Never  Vaping Use   Vaping status: Never Used  Substance Use Topics   Alcohol use: Yes  Comment: socially   Drug use: Yes    Types: Marijuana    Comment: a couple of times per day     Alba Sharper, MD 11/24/24 1006  "

## 2024-11-24 NOTE — Discharge Instructions (Addendum)
-   You have a viral illness causing your symptoms. - I believe this is causing a exacerbation of your reactive airway disease. - I have sent a short course of steroids to help with this to your pharmacy. - I have also refilled your albuterol  inhaler. - This will get better in the next several days. - You may use Tylenol  and Ibuprofen  as needed for pain. - Over the counter allergy medicine such as Claritin and Flonase may help with your congestion. - Cough drops may help with your throat and cough. - If you do not get better in the next 5 days please return to care. - It is important to stay hydrated, and continue eating while sick. - I have also sent you a medicine for nausea called Zofran .

## 2024-11-24 NOTE — ED Triage Notes (Signed)
 PT reports since Monday she has had a fever,cough ,SHOB  and body aches. Pt has been using inhaler but ran out. Pt's boy friend was sick several days before.

## 2025-04-25 ENCOUNTER — Encounter

## 2025-04-25 ENCOUNTER — Other Ambulatory Visit
